# Patient Record
Sex: Female | Born: 1970 | Race: White | Hispanic: No | Marital: Single | Smoking: Current every day smoker
Health system: Southern US, Community
[De-identification: ages and names within clinical notes are randomized; demographics above are authoritative.]

## PROBLEM LIST (undated history)

## (undated) DIAGNOSIS — E039 Hypothyroidism, unspecified: Secondary | ICD-10-CM

## (undated) DIAGNOSIS — M549 Dorsalgia, unspecified: Secondary | ICD-10-CM

## (undated) DIAGNOSIS — R519 Headache, unspecified: Secondary | ICD-10-CM

## (undated) DIAGNOSIS — S99919A Unspecified injury of unspecified ankle, initial encounter: Secondary | ICD-10-CM

## (undated) DIAGNOSIS — F419 Anxiety disorder, unspecified: Secondary | ICD-10-CM

## (undated) DIAGNOSIS — J45909 Unspecified asthma, uncomplicated: Secondary | ICD-10-CM

## (undated) DIAGNOSIS — M1990 Unspecified osteoarthritis, unspecified site: Secondary | ICD-10-CM

## (undated) DIAGNOSIS — R51 Headache: Secondary | ICD-10-CM

## (undated) DIAGNOSIS — E785 Hyperlipidemia, unspecified: Secondary | ICD-10-CM

## (undated) HISTORY — PX: TUBAL LIGATION: SHX77

## (undated) HISTORY — PX: CHOLECYSTECTOMY: SHX55

## (undated) HISTORY — DX: Hypothyroidism, unspecified: E03.9

## (undated) HISTORY — DX: Dorsalgia, unspecified: M54.9

## (undated) HISTORY — DX: Anxiety disorder, unspecified: F41.9

## (undated) HISTORY — DX: Unspecified asthma, uncomplicated: J45.909

## (undated) HISTORY — PX: SHOULDER SURGERY: SHX246

## (undated) HISTORY — DX: Hyperlipidemia, unspecified: E78.5

## (undated) HISTORY — PX: ABDOMINAL HYSTERECTOMY: SHX81

## (undated) HISTORY — PX: HEMORROIDECTOMY: SUR656

## (undated) HISTORY — PX: TONSILLECTOMY AND ADENOIDECTOMY: SHX28

---
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
## 2002-08-25 ENCOUNTER — Emergency Department (HOSPITAL_COMMUNITY): Admission: EM | Admit: 2002-08-25 | Discharge: 2002-08-25 | Payer: Self-pay | Admitting: *Deleted

## 2002-08-25 ENCOUNTER — Encounter: Payer: Self-pay | Admitting: *Deleted

## 2005-12-07 ENCOUNTER — Ambulatory Visit (HOSPITAL_COMMUNITY): Admission: RE | Admit: 2005-12-07 | Discharge: 2005-12-07 | Payer: Self-pay | Admitting: General Surgery

## 2006-06-04 ENCOUNTER — Encounter: Payer: Self-pay | Admitting: Orthopedic Surgery

## 2006-06-21 ENCOUNTER — Encounter: Payer: Self-pay | Admitting: Orthopedic Surgery

## 2007-11-10 ENCOUNTER — Emergency Department (HOSPITAL_COMMUNITY): Admission: EM | Admit: 2007-11-10 | Discharge: 2007-11-10 | Payer: Self-pay | Admitting: Emergency Medicine

## 2009-02-21 ENCOUNTER — Emergency Department (HOSPITAL_COMMUNITY): Admission: EM | Admit: 2009-02-21 | Discharge: 2009-02-21 | Payer: Self-pay | Admitting: Emergency Medicine

## 2009-07-27 ENCOUNTER — Encounter (HOSPITAL_COMMUNITY): Admission: RE | Admit: 2009-07-27 | Discharge: 2009-08-17 | Payer: Self-pay | Admitting: Orthopedic Surgery

## 2010-04-05 ENCOUNTER — Other Ambulatory Visit: Admission: RE | Admit: 2010-04-05 | Discharge: 2010-04-05 | Payer: Self-pay | Admitting: Obstetrics & Gynecology

## 2010-09-11 ENCOUNTER — Encounter: Payer: Self-pay | Admitting: Family Medicine

## 2010-11-02 ENCOUNTER — Other Ambulatory Visit (HOSPITAL_COMMUNITY): Payer: Self-pay | Admitting: Family Medicine

## 2010-11-02 DIAGNOSIS — G8929 Other chronic pain: Secondary | ICD-10-CM

## 2010-11-04 ENCOUNTER — Other Ambulatory Visit (HOSPITAL_COMMUNITY): Payer: Self-pay

## 2010-11-04 ENCOUNTER — Ambulatory Visit (HOSPITAL_COMMUNITY): Payer: Medicaid Other

## 2010-11-11 ENCOUNTER — Ambulatory Visit (HOSPITAL_COMMUNITY)
Admission: RE | Admit: 2010-11-11 | Discharge: 2010-11-11 | Disposition: A | Discharge status: Home / Self Care | Payer: Medicaid Other | Source: Ambulatory Visit | Attending: Family Medicine | Admitting: Family Medicine

## 2010-11-11 DIAGNOSIS — M545 Low back pain, unspecified: Secondary | ICD-10-CM | POA: Insufficient documentation

## 2010-11-11 DIAGNOSIS — M51379 Other intervertebral disc degeneration, lumbosacral region without mention of lumbar back pain or lower extremity pain: Secondary | ICD-10-CM | POA: Insufficient documentation

## 2010-11-11 DIAGNOSIS — M549 Dorsalgia, unspecified: Secondary | ICD-10-CM

## 2010-11-11 DIAGNOSIS — G8929 Other chronic pain: Secondary | ICD-10-CM

## 2010-11-11 DIAGNOSIS — M5137 Other intervertebral disc degeneration, lumbosacral region: Secondary | ICD-10-CM | POA: Insufficient documentation

## 2010-11-11 DIAGNOSIS — M25559 Pain in unspecified hip: Secondary | ICD-10-CM | POA: Insufficient documentation

## 2010-11-11 DIAGNOSIS — R209 Unspecified disturbances of skin sensation: Secondary | ICD-10-CM | POA: Insufficient documentation

## 2011-01-06 NOTE — H&P (Signed)
Chloe Joyce, Chloe Joyce               ACCOUNT NO.:  [id number]   MEDICAL RECORD NO.:  [id number]          PATIENT TYPE:  AMB   LOCATION:  DAY                           FACILITY:  [facility]   PHYSICIAN:  Jerolyn Shin C. Katrinka Blazing, M.D.   DATE OF BIRTH:  Aug 25, 1970   DATE OF ADMISSION:  DATE OF DISCHARGE:  LH                                HISTORY & PHYSICAL   A 40 year old female with six-month history of recurrent rectal bleeding.  She denies constipation.  She denies abdominal pain.  She has failed to  mention her bleeding on her last two evaluations in the office.  She has  grown increasingly concerned because of the increase in frequency of  bleeding and she is scheduled for colonoscopy.   PAST MEDICAL HISTORY:  1.  History of asthma.  2.  Depression.  3.  Gastroesophageal reflux disease.  4.  Osteoarthritis.  5.  Chronic low back pain.   MEDICATIONS:  1.  Zantac 150 twice a day.  2.  Lortab 5/50 four times daily.  3.  Flonase nasal spray twice daily.  4.  Claritin 10 mg daily.  5.  Xanax 0.5 mg three times daily.  6.  Theo-Dur 300 mg twice daily.  7.  Albuterol meter dose inhaler 1 puff four times daily.   SURGERY:  C-section and right shoulder reconstruction.   SOCIAL HISTORY:  She is a single mother of three children.  Unemployed.  She  smokes one pack of cigarettes per day.  She drinks occasional alcoholic  beverage.   PHYSICAL EXAMINATION:  VITAL SIGNS:  Blood pressure 118/78, pulse 78,  respirations 20.  Weight 167 pounds.  HEENT:  Unremarkable.  NECK:  Supple.  No JVD, bruit, adenopathy or thyromegaly.  CHEST:  Clear to auscultation.  HEART:  Regular rate and rhythm without murmur, rub, or gallop.  ABDOMEN:  Soft and nontender.  No masses.  RECTAL:  Normal.  No masses.  Stool guaiac negative.  EXTREMITIES:  No clubbing, cyanosis, or edema.  NEUROLOGICAL:  No focal motor, sensory or cerebellar deficits.   IMPRESSION:  1.  Recurrent rectal bleeding.  2.  Gastroesophageal  reflux disease.  3.  Low back pain.  4.  Bronchial asthma.  5.  Depression.  6.  Osteoarthritis.   PLAN:  Total colonoscopy.      Dirk Dress. Katrinka Blazing, M.D.  Electronically Signed     LCS/MEDQ  D:  12/06/2005  T:  12/06/2005  Job:  [id number]   cc:   Short Stay [facility]

## 2011-01-07 ENCOUNTER — Emergency Department (HOSPITAL_COMMUNITY): Payer: Medicaid Other

## 2011-01-07 ENCOUNTER — Emergency Department (HOSPITAL_COMMUNITY)
Admission: EM | Admit: 2011-01-07 | Discharge: 2011-01-07 | Disposition: A | Discharge status: Home / Self Care | Payer: Medicaid Other | Attending: Emergency Medicine | Admitting: Emergency Medicine

## 2011-01-07 DIAGNOSIS — S0990XA Unspecified injury of head, initial encounter: Secondary | ICD-10-CM | POA: Insufficient documentation

## 2011-01-07 DIAGNOSIS — S0003XA Contusion of scalp, initial encounter: Secondary | ICD-10-CM | POA: Insufficient documentation

## 2011-01-07 DIAGNOSIS — S025XXA Fracture of tooth (traumatic), initial encounter for closed fracture: Secondary | ICD-10-CM | POA: Insufficient documentation

## 2011-01-07 DIAGNOSIS — Y92009 Unspecified place in unspecified non-institutional (private) residence as the place of occurrence of the external cause: Secondary | ICD-10-CM | POA: Insufficient documentation

## 2011-01-07 DIAGNOSIS — S1093XA Contusion of unspecified part of neck, initial encounter: Secondary | ICD-10-CM | POA: Insufficient documentation

## 2012-10-23 ENCOUNTER — Other Ambulatory Visit (HOSPITAL_COMMUNITY): Payer: Self-pay | Admitting: Neurosurgery

## 2012-10-23 DIAGNOSIS — M549 Dorsalgia, unspecified: Secondary | ICD-10-CM

## 2012-10-23 DIAGNOSIS — M5410 Radiculopathy, site unspecified: Secondary | ICD-10-CM

## 2012-10-24 ENCOUNTER — Ambulatory Visit (HOSPITAL_COMMUNITY): Payer: Medicaid Other

## 2013-07-09 ENCOUNTER — Ambulatory Visit: Payer: Self-pay | Admitting: Obstetrics and Gynecology

## 2013-08-06 ENCOUNTER — Ambulatory Visit: Payer: Self-pay | Admitting: Obstetrics and Gynecology

## 2013-08-27 ENCOUNTER — Ambulatory Visit (INDEPENDENT_AMBULATORY_CARE_PROVIDER_SITE_OTHER): Payer: Medicaid Other | Admitting: Obstetrics and Gynecology

## 2013-08-27 ENCOUNTER — Encounter: Payer: Self-pay | Admitting: Obstetrics and Gynecology

## 2013-08-27 VITALS — BP 120/76 | Ht 66.0 in | Wt 166.6 lb

## 2013-08-27 DIAGNOSIS — R102 Pelvic and perineal pain: Secondary | ICD-10-CM

## 2013-08-27 DIAGNOSIS — N949 Unspecified condition associated with female genital organs and menstrual cycle: Secondary | ICD-10-CM

## 2013-08-27 MED ORDER — IBUPROFEN 800 MG PO TABS: 800.0000 mg | ORAL_TABLET | Freq: Three times a day (TID) | ORAL | Status: DC | PRN

## 2013-08-27 NOTE — Progress Notes (Signed)
Patient ID: YANAI HOBSON, female   DOB: Feb 22, 1971, 43 y.o.   MRN: [id number] Pt here today for pain in lower abdominal area/ pelvic area. Right side hurts worse then left. Ovarian cyst. Pt states that she is having heavy periods.   LAP r>L, menses now regular, last pap yrs ago. Last gyn ck 2012    [facility] Visit  Patient name: VASTIE DOUTY MRN [id number]  Date of birth: November 20, 1970  CC & HPI:  CHERYLENE FERRUFINO is a 43 y.o. female presenting today for pelvic ache R>L. Now midcycle  ROS:  Large babies x 3 by svd, cesarean x1 for 6lb 7 oz,(transverse Lie)  Pertinent History Reviewed:  Medical & Surgical Hx:  Reviewed: Significant for asthma, cig use,anxiety Medications: Reviewed & Updated - see associated section Social History: Reviewed -  reports that she has been smoking Cigarettes.  She has a 20 pack-year smoking history. She has never used smokeless tobacco.  Objective Findings:  Vitals: BP 120/76  Ht 5\' 6"  (1.676 m)  Wt 166 lb 9.6 oz (75.569 kg)  BMI 26.90 kg/m2  LMP 08/09/2013  Physical Examination: General appearance - alert, well appearing, and in no distress and oriented to person, place, and time Abdomen - soft, nontender, nondistended, no masses or organomegaly Pelvic - normal external genitalia, vulva, vagina, cervix, uterus and adnexa, examination not indicated Efg : laxity withrectocele Vag lax walls, 1 descensus,   Assessment & Plan:   Heavy menses,  Rectocele 1 degree ut descensus  Plan: return for pap, and discuss posterior repair.

## 2013-09-15 ENCOUNTER — Telehealth: Payer: Self-pay | Admitting: *Deleted

## 2013-09-15 NOTE — Telephone Encounter (Signed)
Spoke with pt. Pt states she is having abd/ovary pain. Getting worse. Has appt here tomorrow. I advised pt could go to ER tonight if pain was severe. Advised to keep scheduled appt here tomorrow. Pt voiced understanding. Rosine

## 2013-09-16 ENCOUNTER — Ambulatory Visit (INDEPENDENT_AMBULATORY_CARE_PROVIDER_SITE_OTHER): Payer: Medicaid Other | Admitting: Obstetrics and Gynecology

## 2013-09-16 ENCOUNTER — Other Ambulatory Visit (HOSPITAL_COMMUNITY)
Admission: RE | Admit: 2013-09-16 | Discharge: 2013-09-16 | Disposition: A | Discharge status: Home / Self Care | Payer: Medicaid Other | Source: Ambulatory Visit | Attending: Obstetrics and Gynecology | Admitting: Obstetrics and Gynecology

## 2013-09-16 ENCOUNTER — Encounter: Payer: Self-pay | Admitting: Obstetrics and Gynecology

## 2013-09-16 VITALS — BP 110/60 | Ht 66.0 in | Wt 167.0 lb

## 2013-09-16 DIAGNOSIS — Z113 Encounter for screening for infections with a predominantly sexual mode of transmission: Secondary | ICD-10-CM | POA: Insufficient documentation

## 2013-09-16 DIAGNOSIS — Z1151 Encounter for screening for human papillomavirus (HPV): Secondary | ICD-10-CM | POA: Insufficient documentation

## 2013-09-16 DIAGNOSIS — G8929 Other chronic pain: Secondary | ICD-10-CM

## 2013-09-16 DIAGNOSIS — R102 Pelvic and perineal pain: Secondary | ICD-10-CM

## 2013-09-16 DIAGNOSIS — N949 Unspecified condition associated with female genital organs and menstrual cycle: Secondary | ICD-10-CM

## 2013-09-16 DIAGNOSIS — Z124 Encounter for screening for malignant neoplasm of cervix: Secondary | ICD-10-CM | POA: Insufficient documentation

## 2013-09-16 DIAGNOSIS — Z01419 Encounter for gynecological examination (general) (routine) without abnormal findings: Secondary | ICD-10-CM
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
## 2013-09-16 NOTE — Patient Instructions (Signed)
Please bring your u/s record with you to an appt in 2 wks. If you want blood work at that appt, we will be able to do that at your followup ,and can go an u/s.

## 2013-09-24 ENCOUNTER — Ambulatory Visit: Payer: Medicaid Other | Admitting: Obstetrics and Gynecology

## 2013-09-25 ENCOUNTER — Other Ambulatory Visit: Payer: Self-pay | Admitting: Obstetrics and Gynecology

## 2013-09-25 DIAGNOSIS — N949 Unspecified condition associated with female genital organs and menstrual cycle: Secondary | ICD-10-CM

## 2013-09-30 ENCOUNTER — Ambulatory Visit (INDEPENDENT_AMBULATORY_CARE_PROVIDER_SITE_OTHER): Payer: Medicaid Other | Admitting: Obstetrics and Gynecology

## 2013-09-30 ENCOUNTER — Ambulatory Visit (INDEPENDENT_AMBULATORY_CARE_PROVIDER_SITE_OTHER): Payer: Medicaid Other

## 2013-09-30 ENCOUNTER — Encounter: Payer: Self-pay | Admitting: Obstetrics and Gynecology

## 2013-09-30 VITALS — BP 112/70 | Ht 66.0 in | Wt 168.2 lb

## 2013-09-30 DIAGNOSIS — N949 Unspecified condition associated with female genital organs and menstrual cycle: Secondary | ICD-10-CM

## 2013-09-30 DIAGNOSIS — R109 Unspecified abdominal pain: Secondary | ICD-10-CM

## 2013-09-30 DIAGNOSIS — Z113 Encounter for screening for infections with a predominantly sexual mode of transmission: Secondary | ICD-10-CM

## 2013-09-30 MED ORDER — TRAMADOL HCL 50 MG PO TABS: 50.0000 mg | ORAL_TABLET | Freq: Four times a day (QID) | ORAL | Status: DC | PRN

## 2013-09-30 NOTE — Patient Instructions (Signed)
Please keep a Pain calendar over the next month. Use ibuprofen and if necessary Tramadol.  I will consider a nerve block to the abdominal wall if the pain persists, and seems musculoskeltal

## 2013-10-01 LAB — RPR

## 2013-10-01 LAB — HSV 2 ANTIBODY, IGG: HSV 2 Glycoprotein G Ab, IgG: <0.1 IV

## 2013-10-01 LAB — HIV ANTIBODY (ROUTINE TESTING W REFLEX): HIV: NONREACTIVE

## 2013-10-01 LAB — HEPATITIS B SURFACE ANTIGEN: Hepatitis B Surface Ag: NEGATIVE

## 2013-10-01 LAB — HEPATITIS C ANTIBODY: HCV Ab: NEGATIVE

## 2013-10-02 NOTE — Progress Notes (Signed)
   [facility] Visit  Patient name: Chloe Joyce MRN [id number]  Date of birth: March 23, 1971  CC & HPI:  Chloe Joyce is a 43 y.o. female presenting today for followup pelvic pain, pt doing better.  ROS:    Pertinent History Reviewed:  Medical & Surgical Hx:  Reviewed: Significant for no changes Medications: Reviewed & Updated - see associated section Social History: Reviewed -  reports that she has been smoking Cigarettes.  She has a 20 pack-year smoking history. She has never used smokeless tobacco.  Objective Findings:  Vitals: BP 112/70  Ht 5\' 6"  (1.676 m)  Wt 168 lb 3.2 oz (76.295 kg)  BMI 27.16 kg/m2  LMP 09/09/2013  Physical Examination: General appearance - alert, well appearing, and in no distress, oriented to person, place, and time and remains distractable, difficult historian,  Mental status - alert, oriented to person, place, and time, confused Abdomen - soft, nontender, nondistended, no masses or organomegaly Pelvic - normal external genitalia, vulva, vagina, cervix, uterus and adnexa   Assessment & Plan:   Pt will not be given further analgesics, as she seems fine.  Pt obtained STD tests at end of visit, when she finally brought up that she wanted testing.

## 2013-10-03 ENCOUNTER — Telehealth: Payer: Self-pay | Admitting: *Deleted

## 2013-10-03 NOTE — Telephone Encounter (Signed)
Pt informed STD screening negative from 09/30/2013.

## 2013-10-03 NOTE — Telephone Encounter (Signed)
Message copied by Doyne Keel on Fri Oct 03, 2013  8:27 AM ------      Message from: Jonnie Kind      Created: Thu Oct 02, 2013  6:39 PM       All tests for STD are negative. Please inform pt ------

## 2013-10-29 ENCOUNTER — Encounter: Payer: Self-pay | Admitting: Obstetrics and Gynecology

## 2013-10-29 ENCOUNTER — Ambulatory Visit (INDEPENDENT_AMBULATORY_CARE_PROVIDER_SITE_OTHER): Payer: Medicaid Other | Admitting: Obstetrics and Gynecology

## 2013-10-29 VITALS — BP 112/68 | Ht 62.0 in | Wt 170.0 lb

## 2013-10-29 DIAGNOSIS — R1031 Right lower quadrant pain: Secondary | ICD-10-CM

## 2013-10-29 DIAGNOSIS — G8929 Other chronic pain: Secondary | ICD-10-CM | POA: Insufficient documentation

## 2013-10-29 LAB — CBC
HCT: 37.2 % (ref 36.0–46.0)
Hemoglobin: 12.2 g/dL (ref 12.0–15.0)
MCH: 31.8 pg (ref 26.0–34.0)
MCHC: 32.8 g/dL (ref 30.0–36.0)
MCV: 96.9 fL (ref 78.0–100.0)
Platelets: 333 10*3/uL (ref 150–400)
RBC: 3.84 MIL/uL — ABNORMAL LOW (ref 3.87–5.11)
RDW: 13 % (ref 11.5–15.5)
WBC: 6.2 10*3/uL (ref 4.0–10.5)

## 2013-10-29 NOTE — Patient Instructions (Signed)
Follow up with your primary care or a primary care of your choice for your chronic pain. Your pain does not have any GYN identifiable cause. Could be related to back.

## 2013-10-29 NOTE — Progress Notes (Signed)
This chart was scribed by Jenne Campus, Medical Scribe, for Dr. Mallory Shirk on 10/29/13 at 2:41 PM. This chart was reviewed by Dr. Mallory Shirk and is accurate.    [facility] Visit  Patient name: Chloe Joyce MRN [id number]  Date of birth: Sep 22, 1970  CC & HPI:  Chloe Joyce is a 43 y.o. female presenting today for :"same thing" has pain she rates a "10" quite often, several times a month. 3weeks / month. LMP was 8 March. Pt feels "used to it." Localizes pain to RLQ, will radiate down the right leg occasionally. Worse if sitting up.  Negative STD testing on 09/30/13. US showed a simple cyst disproportionate to pain. Last BM was ?this morning. S/p BTL and c-section.   PCP is [facility]. One c-section.  ROS:  +chronic pelvic pain Denies any other symptoms  Pertinent History Reviewed:  Medical & Surgical Hx:  Reviewed: Significant for chronic pelvic pain Medications: Reviewed & Updated - see associated section Social History: Reviewed -  reports that she has been smoking Cigarettes.  She has a 20 pack-year smoking history. She has never used smokeless tobacco.  Objective Findings:  Vitals: BP 112/68  Ht 5\' 2"  (1.575 m)  Wt 170 lb (77.111 kg)  BMI 31.09 kg/m2 Chaperone present for exam which was performed with pt's permission. Physical Examination: General appearance - alert, well appearing, and in no distress and oriented to person, place, and time Mental status - alert, oriented to person, place, and time, normal mood, behavior, speech, dress, motor activity, and thought processes Abdomen - soft, nontender, nondistended, no masses or organomegaly bowel sounds normal Pelvic - normal external genitalia, vulva, vagina, cervix, and adnexa, non-tender and totally mobile uterus, minimal discomfort with palpation of uterus "I can feel you touching me"   Assessment & Plan:  A: 1. No identifiable GYN ideology to chronic RLQ pain  P: 1. Check CBC sed  rate  2. Recommend referral back to PCP of pt's choice or [facility]

## 2013-10-30 LAB — SEDIMENTATION RATE: Sed Rate: 1 mm/hr (ref 0–22)

## 2014-02-16 ENCOUNTER — Emergency Department (HOSPITAL_COMMUNITY)
Admission: EM | Admit: 2014-02-16 | Discharge: 2014-02-16 | Disposition: A | Discharge status: Home / Self Care | Payer: Medicaid Other | Attending: Emergency Medicine | Admitting: Emergency Medicine

## 2014-02-16 ENCOUNTER — Emergency Department (HOSPITAL_COMMUNITY): Payer: Medicaid Other

## 2014-02-16 ENCOUNTER — Encounter (HOSPITAL_COMMUNITY): Payer: Self-pay | Admitting: Emergency Medicine

## 2014-02-16 DIAGNOSIS — Y92410 Unspecified street and highway as the place of occurrence of the external cause: Secondary | ICD-10-CM | POA: Insufficient documentation

## 2014-02-16 DIAGNOSIS — F411 Generalized anxiety disorder: Secondary | ICD-10-CM | POA: Insufficient documentation

## 2014-02-16 DIAGNOSIS — E785 Hyperlipidemia, unspecified: Secondary | ICD-10-CM | POA: Insufficient documentation

## 2014-02-16 DIAGNOSIS — S139XXA Sprain of joints and ligaments of unspecified parts of neck, initial encounter: Secondary | ICD-10-CM | POA: Insufficient documentation

## 2014-02-16 DIAGNOSIS — F17200 Nicotine dependence, unspecified, uncomplicated: Secondary | ICD-10-CM | POA: Insufficient documentation

## 2014-02-16 DIAGNOSIS — S161XXA Strain of muscle, fascia and tendon at neck level, initial encounter: Secondary | ICD-10-CM

## 2014-02-16 DIAGNOSIS — Y9389 Activity, other specified: Secondary | ICD-10-CM | POA: Insufficient documentation

## 2014-02-16 DIAGNOSIS — J45909 Unspecified asthma, uncomplicated: Secondary | ICD-10-CM | POA: Insufficient documentation

## 2014-02-16 DIAGNOSIS — Z79899 Other long term (current) drug therapy: Secondary | ICD-10-CM | POA: Insufficient documentation

## 2014-02-16 MED ORDER — CYCLOBENZAPRINE HCL 10 MG PO TABS: 10.0000 mg | ORAL_TABLET | Freq: Two times a day (BID) | ORAL | Status: DC | PRN
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
## 2014-02-16 NOTE — ED Notes (Signed)
Pt continues to c/o pain in neck and back.  Pt expressing anger with physician at not being prescribed narcotics. Reinforced with pt various methods for pain control.  Pt continues to be angry.
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
## 2014-06-30 ENCOUNTER — Encounter (HOSPITAL_COMMUNITY): Payer: Self-pay

## 2014-06-30 ENCOUNTER — Emergency Department (HOSPITAL_COMMUNITY)
Admission: EM | Admit: 2014-06-30 | Discharge: 2014-07-01 | Disposition: A | Discharge status: Home / Self Care | Payer: Medicaid Other | Attending: Emergency Medicine | Admitting: Emergency Medicine

## 2014-06-30 DIAGNOSIS — Z791 Long term (current) use of non-steroidal anti-inflammatories (NSAID): Secondary | ICD-10-CM | POA: Insufficient documentation

## 2014-06-30 DIAGNOSIS — E785 Hyperlipidemia, unspecified: Secondary | ICD-10-CM | POA: Diagnosis not present

## 2014-06-30 DIAGNOSIS — J441 Chronic obstructive pulmonary disease with (acute) exacerbation: Secondary | ICD-10-CM

## 2014-06-30 DIAGNOSIS — Z7952 Long term (current) use of systemic steroids: Secondary | ICD-10-CM | POA: Diagnosis not present

## 2014-06-30 DIAGNOSIS — F419 Anxiety disorder, unspecified: Secondary | ICD-10-CM | POA: Diagnosis not present

## 2014-06-30 DIAGNOSIS — Z720 Tobacco use: Secondary | ICD-10-CM | POA: Insufficient documentation

## 2014-06-30 DIAGNOSIS — Z792 Long term (current) use of antibiotics: Secondary | ICD-10-CM | POA: Diagnosis not present

## 2014-06-30 DIAGNOSIS — R0602 Shortness of breath: Secondary | ICD-10-CM | POA: Diagnosis present

## 2014-06-30 DIAGNOSIS — R062 Wheezing: Secondary | ICD-10-CM

## 2014-06-30 DIAGNOSIS — Z79899 Other long term (current) drug therapy: Secondary | ICD-10-CM | POA: Insufficient documentation
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
## 2014-07-01 ENCOUNTER — Emergency Department (HOSPITAL_COMMUNITY): Payer: Medicaid Other

## 2014-07-01 MED ORDER — IPRATROPIUM-ALBUTEROL 0.5-2.5 (3) MG/3ML IN SOLN
3.0000 mL | Freq: Once | RESPIRATORY_TRACT | Status: AC
  Administered 2014-07-01: 3 mL via RESPIRATORY_TRACT
  Filled 2014-07-01: qty 3

## 2014-07-01 MED ORDER — ALBUTEROL SULFATE HFA 108 (90 BASE) MCG/ACT IN AERS: 1.0000 | INHALATION_SPRAY | Freq: Four times a day (QID) | RESPIRATORY_TRACT | Status: DC | PRN

## 2014-07-01 MED ORDER — ALBUTEROL SULFATE (2.5 MG/3ML) 0.083% IN NEBU
2.5000 mg | INHALATION_SOLUTION | Freq: Once | RESPIRATORY_TRACT | Status: AC
  Administered 2014-07-01: 2.5 mg via RESPIRATORY_TRACT
  Filled 2014-07-01: qty 3

## 2014-07-01 MED ORDER — THEOPHYLLINE ER 300 MG PO CP24: 300.0000 mg | ORAL_CAPSULE | Freq: Three times a day (TID) | ORAL | Status: DC

## 2014-07-01 MED ORDER — AZITHROMYCIN 250 MG PO TABS: ORAL_TABLET | ORAL | Status: DC

## 2014-07-01 MED ORDER — PREDNISONE 50 MG PO TABS
60.0000 mg | ORAL_TABLET | Freq: Once | ORAL | Status: AC
  Administered 2014-07-01: 60 mg via ORAL
  Filled 2014-07-01 (×2): qty 1

## 2014-07-01 MED ORDER — PREDNISONE 50 MG PO TABS: ORAL_TABLET | ORAL | Status: DC

## 2014-07-01 NOTE — ED Notes (Signed)
Patient ambulatory to restroom. Family remain at bedside.

## 2014-07-01 NOTE — ED Provider Notes (Signed)
CSN: [id number]     Arrival date & time 06/30/14  2348 History   First MD Initiated Contact with Patient 06/30/14 2354     Chief Complaint  Patient presents with  . Shortness of Breath     (Consider location/radiation/quality/duration/timing/severity/associated sxs/prior Treatment) HPI.....coughing and wheezing for 2 weeks with associated shortness of breath. Patient is a long-term smoker. No fever, chills, rusty sputum. She gets short of breath with exertion. Severity is mild to moderate. She was unable to see her primary care doctor  Past Medical History  Diagnosis Date  . Anxiety   . Asthma   . Hyperlipidemia    Past Surgical History  Procedure Laterality Date  . Tubal ligation    . Tonsillectomy and adenoidectomy  1988  . Shoulder surgery Right   . Cesarean section    . Hemorroidectomy    . Cholecystectomy     Family History  Problem Relation Age of Onset  . Hypertension Mother   . Cancer Mother     breast  . Stroke Mother   . Hyperlipidemia Mother   . Varicose Veins Mother   . Miscarriages / [place] Mother   . Asthma Brother   . Diabetes Maternal Grandmother    History  Substance Use Topics  . Smoking status: Current Every Day Smoker -- 1.00 packs/day for 20 years    Types: Cigarettes  . Smokeless tobacco: Never Used  . Alcohol Use: Yes     Comment: occ.   OB History    No data available     Review of Systems  All other systems reviewed and are negative.     Allergies  Review of patient's allergies indicates no known allergies.  Home Medications   Prior to Admission medications   Medication Sig Start Date End Date Taking? Authorizing Provider  loratadine (CLARITIN) 10 MG tablet Take 10 mg by mouth daily.   Yes Historical Provider, MD  simvastatin (ZOCOR) 40 MG tablet Take 20 mg by mouth every evening.   Yes Historical Provider, MD  albuterol (PROVENTIL HFA;VENTOLIN HFA) 108 (90 BASE) MCG/ACT inhaler Inhale 1-2 puffs into the lungs every 6  (six) hours as needed for wheezing or shortness of breath. 07/01/14   Nat Christen, MD  azithromycin (ZITHROMAX Z-PAK) 250 MG tablet 2 po day one, then 1 daily x 4 days 07/01/14   Nat Christen, MD  cyclobenzaprine (FLEXERIL) 10 MG tablet Take 1 tablet (10 mg total) by mouth 2 (two) times daily as needed for muscle spasms. 02/16/14   Shaune Pollack, MD  furosemide (LASIX) 20 MG tablet Take 20 mg by mouth daily as needed for fluid.    Historical Provider, MD  ibuprofen (ADVIL,MOTRIN) 800 MG tablet Take 1 tablet (800 mg total) by mouth every 8 (eight) hours as needed. 08/27/13   Jonnie Kind, MD  predniSONE (DELTASONE) 50 MG tablet 1 daily for 6 days, one half daily for 6 days 07/01/14   Nat Christen, MD  traMADol (ULTRAM) 50 MG tablet Take 50 mg by mouth 3 (three) times daily as needed for moderate pain or severe pain.    Historical Provider, MD   BP 117/79 mmHg  Pulse 120  Temp(Src) 98 F (36.7 C) (Oral)  Resp 23  Ht 5\' 6"  (1.676 m)  Wt 150 lb (68.04 kg)  BMI 24.22 kg/m2  SpO2 98%  LMP 06/16/2014 Physical Exam  Constitutional: She is oriented to person, place, and time. She appears well-developed and well-nourished.  HENT:  Head: Normocephalic  and atraumatic.  Eyes: Conjunctivae and EOM are normal. Pupils are equal, round, and reactive to light.  Neck: Normal range of motion. Neck supple.  Cardiovascular: Normal rate, regular rhythm and normal heart sounds.   Pulmonary/Chest: Effort normal.  Bilateral expiratory wheeze  Abdominal: Soft. Bowel sounds are normal.  Musculoskeletal: Normal range of motion.  Neurological: She is alert and oriented to person, place, and time.  Skin: Skin is warm and dry.  Psychiatric: She has a normal mood and affect. Her behavior is normal.  Nursing note and vitals reviewed.   ED Course  Procedures (including critical care time) Labs Review Labs Reviewed - No data to display  Imaging Review Dg Chest 2 View  07/01/2014   CLINICAL DATA:  Shortness of  breath and wheezing for 2 weeks.  EXAM: CHEST  2 VIEW  COMPARISON:  None.  FINDINGS: Heart size and mediastinal contours are within normal limits. Both lungs are clear. Visualized skeletal structures are unremarkable.  IMPRESSION: Negative exam.   Electronically Signed   By: Inge Rise M.D.   On: 07/01/2014 00:46     EKG Interpretation None      MDM   Final diagnoses:  Wheezing  COPD exacerbation    Patient is ambulatory without obvious dyspnea. 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 She feels better after an albuterol/Atrovent nebulizer treatment. Chest x-ray negative for pneumonia.  Because of the longevity of her symptoms, I will start an antibiotic. Discharge medications Zithromax, prednisone, albuterol inhaler.    Nat Christen, MD 07/01/14 [phone]

## 2014-07-01 NOTE — Discharge Instructions (Signed)
Chest x-ray showed no pneumonia. Stop smoking. Prescriptions for prednisone, inhaler, antibiotic. Follow-up your primary care doctor

## 2014-07-01 NOTE — ED Notes (Signed)
Patient verbalizes understanding of discharge instructions, prescription medications, home care, and follow up care. Patient out of department at this time with family.

## 2014-08-28 ENCOUNTER — Encounter (HOSPITAL_COMMUNITY): Payer: Self-pay | Admitting: Emergency Medicine

## 2014-08-28 ENCOUNTER — Emergency Department (HOSPITAL_COMMUNITY)
Admission: EM | Admit: 2014-08-28 | Discharge: 2014-08-28 | Disposition: A | Discharge status: Home / Self Care | Payer: Medicaid Other | Attending: Emergency Medicine | Admitting: Emergency Medicine

## 2014-08-28 ENCOUNTER — Emergency Department (HOSPITAL_COMMUNITY): Payer: Medicaid Other

## 2014-08-28 DIAGNOSIS — E785 Hyperlipidemia, unspecified: Secondary | ICD-10-CM | POA: Insufficient documentation

## 2014-08-28 DIAGNOSIS — Z7952 Long term (current) use of systemic steroids: Secondary | ICD-10-CM | POA: Diagnosis not present

## 2014-08-28 DIAGNOSIS — Z720 Tobacco use: Secondary | ICD-10-CM | POA: Diagnosis not present

## 2014-08-28 DIAGNOSIS — F419 Anxiety disorder, unspecified: Secondary | ICD-10-CM | POA: Insufficient documentation

## 2014-08-28 DIAGNOSIS — R05 Cough: Secondary | ICD-10-CM

## 2014-08-28 DIAGNOSIS — J45901 Unspecified asthma with (acute) exacerbation: Secondary | ICD-10-CM | POA: Diagnosis not present

## 2014-08-28 DIAGNOSIS — Z79899 Other long term (current) drug therapy: Secondary | ICD-10-CM | POA: Insufficient documentation

## 2014-08-28 DIAGNOSIS — R059 Cough, unspecified: Secondary | ICD-10-CM

## 2014-08-28 LAB — URINALYSIS, ROUTINE W REFLEX MICROSCOPIC
Bilirubin Urine: NEGATIVE
Glucose, UA: NEGATIVE mg/dL
Hgb urine dipstick: NEGATIVE
Ketones, ur: NEGATIVE mg/dL
Leukocytes, UA: NEGATIVE
Nitrite: NEGATIVE
Protein, ur: NEGATIVE mg/dL
Specific Gravity, Urine: <1.005 — ABNORMAL LOW (ref 1.005–1.030)
Urobilinogen, UA: 0.2 mg/dL (ref 0.0–1.0)
pH: 5.5 (ref 5.0–8.0)

## 2014-08-28 MED ORDER — ALBUTEROL SULFATE HFA 108 (90 BASE) MCG/ACT IN AERS
2.0000 | INHALATION_SPRAY | RESPIRATORY_TRACT | Status: DC | PRN
  Administered 2014-08-28: 2 via RESPIRATORY_TRACT
  Filled 2014-08-28: qty 6.7

## 2014-08-28 MED ORDER — PREDNISONE 50 MG PO TABS
60.0000 mg | ORAL_TABLET | Freq: Once | ORAL | Status: AC
  Administered 2014-08-28: 60 mg via ORAL
  Filled 2014-08-28 (×2): qty 1

## 2014-08-28 MED ORDER — IPRATROPIUM-ALBUTEROL 0.5-2.5 (3) MG/3ML IN SOLN
3.0000 mL | Freq: Once | RESPIRATORY_TRACT | Status: AC
  Administered 2014-08-28: 3 mL via RESPIRATORY_TRACT
  Filled 2014-08-28: qty 3

## 2014-08-28 MED ORDER — PREDNISONE 50 MG PO TABS: 50.0000 mg | ORAL_TABLET | Freq: Every day | ORAL | Status: DC
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
## 2014-08-28 NOTE — Discharge Instructions (Signed)
Asthma Asthma is a recurring condition in which the airways tighten and narrow. Asthma can make it difficult to breathe. It can cause coughing, wheezing, and shortness of breath. Asthma episodes, also called asthma attacks, range from minor to life-threatening. Asthma cannot be cured, but medicines and lifestyle changes can help control it. CAUSES Asthma is believed to be caused by inherited (genetic) and environmental factors, but its exact cause is unknown. Asthma may be triggered by allergens, lung infections, or irritants in the air. Asthma triggers are different for each person. Common triggers include:   Animal dander.  Dust mites.  Cockroaches.  Pollen from trees or grass.  Mold.  Smoke.  Air pollutants such as dust, household cleaners, hair sprays, aerosol sprays, paint fumes, strong chemicals, or strong odors.  Cold air, weather changes, and winds (which increase molds and pollens in the air).  Strong emotional expressions such as crying or laughing hard.  Stress.  Certain medicines (such as aspirin) or types of drugs (such as beta-blockers).  Sulfites in foods and drinks. Foods and drinks that may contain sulfites include dried fruit, potato chips, and sparkling grape juice.  Infections or inflammatory conditions such as the flu, a cold, or an inflammation of the nasal membranes (rhinitis).  Gastroesophageal reflux disease (GERD).  Exercise or strenuous activity. SYMPTOMS Symptoms may occur immediately after asthma is triggered or many hours later. Symptoms include:  Wheezing.  Excessive nighttime or early morning coughing.  Frequent or severe coughing with a common cold.  Chest tightness.  Shortness of breath. DIAGNOSIS  The diagnosis of asthma is made by a review of your medical history and a physical exam. Tests may also be performed. These may include:  Lung function studies. These tests show how much air you breathe in and out.  Allergy  tests.  Imaging tests such as X-rays. TREATMENT  Asthma cannot be cured, but it can usually be controlled. Treatment involves identifying and avoiding your asthma triggers. It also involves medicines. There are 2 classes of medicine used for asthma treatment:   Controller medicines. These prevent asthma symptoms from occurring. They are usually taken every day.  Reliever or rescue medicines. These quickly relieve asthma symptoms. They are used as needed and provide short-term relief. Your health care provider will help you create an asthma action plan. An asthma action plan is a written plan for managing and treating your asthma attacks. It includes a list of your asthma triggers and how they may be avoided. It also includes information on when medicines should be taken and when their dosage should be changed. An action plan may also involve the use of a device called a peak flow meter. A peak flow meter measures how well the lungs are working. It helps you monitor your condition. HOME CARE INSTRUCTIONS   Take medicines only as directed by your health care provider. Speak with your health care provider if you have questions about how or when to take the medicines.  Use a peak flow meter as directed by your health care provider. Record and keep track of readings.  Understand and use the action plan to help minimize or stop an asthma attack without needing to seek medical care.  Control your home environment in the following ways to help prevent asthma attacks:  Do not smoke. Avoid being exposed to secondhand smoke.  Change your heating and air conditioning filter regularly.  Limit your use of fireplaces and wood stoves.  Get rid of pests (such as roaches and  mice) and their droppings.  Throw away plants if you see mold on them.  Clean your floors and dust regularly. Use unscented cleaning products.  Try to have someone else vacuum for you regularly. Stay out of rooms while they are  being vacuumed and for a short while afterward. If you vacuum, use a dust mask from a hardware store, a double-layered or microfilter vacuum cleaner bag, or a vacuum cleaner with a HEPA filter.  Replace carpet with wood, tile, or vinyl flooring. Carpet can trap dander and dust.  Use allergy-proof pillows, mattress covers, and box spring covers.  Wash bed sheets and blankets every week in hot water and dry them in a dryer.  Use blankets that are made of polyester or cotton.  Clean bathrooms and kitchens with bleach. If possible, have someone repaint the walls in these rooms with mold-resistant paint. Keep out of the rooms that are being cleaned and painted.  Wash hands frequently. SEEK MEDICAL CARE IF:   You have wheezing, shortness of breath, or a cough even if taking medicine to prevent attacks.  The colored mucus you cough up (sputum) is thicker than usual.  Your sputum changes from clear or white to yellow, green, gray, or bloody.  You have any problems that may be related to the medicines you are taking (such as a rash, itching, swelling, or trouble breathing).  You are using a reliever medicine more than 2-3 times per week.  Your peak flow is still at 50-79% of your personal best after following your action plan for 1 hour.  You have a fever. SEEK IMMEDIATE MEDICAL CARE IF:   You seem to be getting worse and are unresponsive to treatment during an asthma attack.  You are short of breath even at rest.  You get short of breath when doing very little physical activity.  You have difficulty eating, drinking, or talking due to asthma symptoms.  You develop chest pain.  You develop a fast heartbeat.  You have a bluish color to your lips or fingernails.  You are light-headed, dizzy, or faint.  Your peak flow is less than 50% of your personal best. MAKE SURE YOU:   Understand these instructions.  Will watch your condition.  Will get help right away if you are not  doing well or get worse. Document Released: 08/07/2005 Document Revised: 12/22/2013 Document Reviewed: 03/06/2013 [facility] Patient Information 2015 [place], [place]. This information is not intended to replace advice given to you by your health care provider. Make sure you discuss any questions you have with your health care provider.  Smoking Hazards Smoking cigarettes is extremely bad for your health. Tobacco smoke has over 200 known poisons in it. It contains the poisonous gases nitrogen oxide and carbon monoxide. There are over 60 chemicals in tobacco smoke that cause cancer. Some of the chemicals found in cigarette smoke include:   Cyanide.   Benzene.   Formaldehyde.   Methanol (wood alcohol).   Acetylene (fuel used in welding torches).   Ammonia.  Even smoking lightly shortens your life expectancy by several years. You can greatly reduce the risk of medical problems for you and your family by stopping now. Smoking is the most preventable cause of death and disease in our society. Within days of quitting smoking, your circulation improves, you decrease the risk of having a heart attack, and your lung capacity improves. There may be some increased phlegm in the first few days after quitting, and it may take months for  your lungs to clear up completely. Quitting for 10 years reduces your risk of developing lung cancer to almost that of a nonsmoker.  WHAT ARE THE RISKS OF SMOKING? Cigarette smokers have an increased risk of many serious medical problems, including:  Lung cancer.   Lung disease (such as pneumonia, bronchitis, and emphysema).   Heart attack and chest pain due to the heart not getting enough oxygen (angina).   Heart disease and peripheral blood vessel disease.   Hypertension.   Stroke.   Oral cancer (cancer of the lip, mouth, or voice box).   Bladder cancer.   Pancreatic cancer.   Cervical cancer.   Pregnancy complications, including premature  birth.   Stillbirths and smaller newborn babies, birth defects, and genetic damage to sperm.   Early menopause.   Lower estrogen level for women.   Infertility.   Facial wrinkles.   Blindness.   Increased risk of broken bones (fractures).   Senile dementia.   Stomach ulcers and internal bleeding.   Delayed wound healing and increased risk of complications during surgery. Because of secondhand smoke exposure, children of smokers have an increased risk of the following:   Sudden infant death syndrome (SIDS).   Respiratory infections.   Lung cancer.   Heart disease.   Ear infections.  WHY IS SMOKING ADDICTIVE? Nicotine is the chemical agent in tobacco that is capable of causing addiction or dependence. When you smoke and inhale, nicotine is absorbed rapidly into the bloodstream through your lungs. Both inhaled and noninhaled nicotine may be addictive.  WHAT ARE THE BENEFITS OF QUITTING?  There are many health benefits to quitting smoking. Some are:   The likelihood of developing cancer and heart disease decreases. Health improvements are seen almost immediately.   Blood pressure, pulse rate, and breathing patterns start returning to normal soon after quitting.   People who quit may see an improvement in their overall quality of life.  HOW DO YOU QUIT SMOKING? Smoking is an addiction with both physical and psychological effects, and longtime habits can be hard to change. Your health care provider can recommend:  Programs and community resources, which may include group support, education, or therapy.  Replacement products, such as patches, gum, and nasal sprays. Use these products only as directed. Do not replace cigarette smoking with electronic cigarettes (commonly called e-cigarettes). The safety of e-cigarettes is unknown, and some may contain harmful chemicals. FOR MORE INFORMATION  American Lung Association: www.lung.org  American Cancer  Society: www.cancer.org Document Released: 09/14/2004 Document Revised: 05/28/2013 Document Reviewed: 01/27/2013 [facility] Patient Information 2015 [place], [place]. This information is not intended to replace advice given to you by your health care provider. Make sure you discuss any questions you have with your health care provider.  Albuterol inhalation aerosol What is this medicine? ALBUTEROL (al Norm[place]ean [place]) is a bronchodilator. It helps open up the airways in your lungs to make it easier to breathe. This medicine is used to treat and to prevent bronchospasm. This medicine may be used for other purposes; ask your health care provider or pharmacist if you have questions. COMMON BRAND NAME(S): Proair HFA, Proventil, Proventil HFA, Respirol, Ventolin, Ventolin HFA What should I tell my health care provider before I take this medicine? They need to know if you have any of the following conditions: -diabetes -heart disease or irregular heartbeat -high blood pressure -pheochromocytoma -seizures -thyroid disease -an unusual or allergic reaction to albuterol, levalbuterol, sulfites, other medicines, foods, dyes, or preservatives -pregnant or trying to  get pregnant -breast-feeding How should I use this medicine? This medicine is for inhalation through the mouth. Follow the directions on your prescription label. Take your medicine at regular intervals. Do not use more often than directed. Make sure that you are using your inhaler correctly. Ask you doctor or health care provider if you have any questions. Talk to your pediatrician regarding the use of this medicine in children. Special care may be needed. Overdosage: If you think you have taken too much of this medicine contact a poison control center or emergency room at once. NOTE: This medicine is only for you. Do not share this medicine with others. What if I miss a dose? If you miss a dose, use it as soon as you can. If it is almost time for  your next dose, use only that dose. Do not use double or extra doses. What may interact with this medicine? -anti-infectives like chloroquine and pentamidine -caffeine -cisapride -diuretics -medicines for colds -medicines for depression or for emotional or psychotic conditions -medicines for weight loss including some herbal products -methadone -some antibiotics like clarithromycin, erythromycin, levofloxacin, and linezolid -some heart medicines -steroid hormones like dexamethasone, cortisone, hydrocortisone -theophylline -thyroid hormones This list may not describe all possible interactions. Give your health care provider a list of all the medicines, herbs, non-prescription drugs, or dietary supplements you use. Also tell them if you smoke, drink alcohol, or use illegal drugs. Some items may interact with your medicine. What should I watch for while using this medicine? Tell your doctor or health care professional if your symptoms do not improve. Do not use extra albuterol. If your asthma or bronchitis gets worse while you are using this medicine, call your doctor right away. If your mouth gets dry try chewing sugarless gum or sucking hard candy. Drink water as directed. What side effects may I notice from receiving this medicine? Side effects that you should report to your doctor or health care professional as soon as possible: -allergic reactions like skin rash, itching or hives, swelling of the face, lips, or tongue -breathing problems -chest pain -feeling faint or lightheaded, falls -high blood pressure -irregular heartbeat -fever -muscle cramps or weakness -pain, tingling, numbness in the hands or feet -vomiting Side effects that usually do not require medical attention (report to your doctor or health care professional if they continue or are bothersome): -cough -difficulty sleeping -headache -nervousness or trembling -stomach upset -stuffy or runny nose -throat  irritation -unusual taste This list may not describe all possible side effects. Call your doctor for medical advice about side effects. You may report side effects to FDA at 1-800-FDA-1088. Where should I keep my medicine? Keep out of the reach of children. Store at room temperature between 15 and 30 degrees C (59 and 86 degrees F). The contents are under pressure and may burst when exposed to heat or flame. Do not freeze. This medicine does not work as well if it is too cold. Throw away any unused medicine after the expiration date. Inhalers need to be thrown away after the labeled number of puffs have been used or by the expiration date; whichever comes first. Ventolin HFA should be thrown away 12 months after removing from foil pouch. Check the instructions that come with your medicine. NOTE: This sheet is a summary. It may not cover all possible information. If you have questions about this medicine, talk to your doctor, pharmacist, or health care provider.  2015, [employer] Standard. (2013-01-23 10:57:17)  Prednisone tablets What  is this medicine? PREDNISONE (PRED ni sone) is a corticosteroid. It is commonly used to treat inflammation of the skin, joints, lungs, and other organs. Common conditions treated include asthma, allergies, and arthritis. It is also used for other conditions, such as blood disorders and diseases of the adrenal glands. This medicine may be used for other purposes; ask your health care provider or pharmacist if you have questions. COMMON BRAND NAME(S): Deltasone, Predone, Sterapred, Sterapred DS What should I tell my health care provider before I take this medicine? They need to know if you have any of these conditions: -Cushing's syndrome -diabetes -glaucoma -heart disease -high blood pressure -infection (especially a virus infection such as chickenpox, cold sores, or herpes) -kidney disease -liver disease -mental illness -myasthenia  gravis -osteoporosis -seizures -stomach or intestine problems -thyroid disease -an unusual or allergic reaction to lactose, prednisone, other medicines, foods, dyes, or preservatives -pregnant or trying to get pregnant -breast-feeding How should I use this medicine? Take this medicine by mouth with a glass of water. Follow the directions on the prescription label. Take this medicine with food. If you are taking this medicine once a day, take it in the morning. Do not take more medicine than you are told to take. Do not suddenly stop taking your medicine because you may develop a severe reaction. Your doctor will tell you how much medicine to take. If your doctor wants you to stop the medicine, the dose may be slowly lowered over time to avoid any side effects. Talk to your pediatrician regarding the use of this medicine in children. Special care may be needed. Overdosage: If you think you have taken too much of this medicine contact a poison control center or emergency room at once. NOTE: This medicine is only for you. Do not share this medicine with others. What if I miss a dose? If you miss a dose, take it as soon as you can. If it is almost time for your next dose, talk to your doctor or health care professional. You may need to miss a dose or take an extra dose. Do not take double or extra doses without advice. What may interact with this medicine? Do not take this medicine with any of the following medications: -metyrapone -mifepristone This medicine may also interact with the following medications: -aminoglutethimide -amphotericin B -aspirin and aspirin-like medicines -barbiturates -certain medicines for diabetes, like glipizide or glyburide -cholestyramine -cholinesterase inhibitors -cyclosporine -digoxin -diuretics -ephedrine -female hormones, like estrogens and birth control pills -isoniazid -ketoconazole -NSAIDS, medicines for pain and inflammation, like ibuprofen or  naproxen -phenytoin -rifampin -toxoids -vaccines -warfarin This list may not describe all possible interactions. Give your health care provider a list of all the medicines, herbs, non-prescription drugs, or dietary supplements you use. Also tell them if you smoke, drink alcohol, or use illegal drugs. Some items may interact with your medicine. What should I watch for while using this medicine? Visit your doctor or health care professional for regular checks on your progress. If you are taking this medicine over a prolonged period, carry an identification card with your name and address, the type and dose of your medicine, and your doctor's name and address. This medicine may increase your risk of getting an infection. Tell your doctor or health care professional if you are around anyone with measles or chickenpox, or if you develop sores or blisters that do not heal properly. If you are going to have surgery, tell your doctor or health care professional that you have  taken this medicine within the last twelve months. Ask your doctor or health care professional about your diet. You may need to lower the amount of salt you eat. This medicine may affect blood sugar levels. If you have diabetes, check with your doctor or health care professional before you change your diet or the dose of your diabetic medicine. What side effects may I notice from receiving this medicine? Side effects that you should report to your doctor or health care professional as soon as possible: -allergic reactions like skin rash, itching or hives, swelling of the face, lips, or tongue -changes in emotions or moods -changes in vision -depressed mood -eye pain -fever or chills, cough, sore throat, pain or difficulty passing urine -increased thirst -swelling of ankles, feet Side effects that usually do not require medical attention (report to your doctor or health care professional if they continue or are  bothersome): -confusion, excitement, restlessness -headache -nausea, vomiting -skin problems, acne, thin and shiny skin -trouble sleeping -weight gain This list may not describe all possible side effects. Call your doctor for medical advice about side effects. You may report side effects to FDA at 1-800-FDA-1088. Where should I keep my medicine? Keep out of the reach of children. Store at room temperature between 15 and 30 degrees C (59 and 86 degrees F). Protect from light. Keep container tightly closed. Throw away any unused medicine after the expiration date. NOTE: This sheet is a summary. It may not cover all possible information. If you have questions about this medicine, talk to your doctor, pharmacist, or health care provider.  2015, [employer] Standard. (2011-03-23 10:57:14)

## 2014-08-28 NOTE — ED Notes (Signed)
Pt in police custody on arrival to ED, [employer] remained at bedside with pt in handcuffs for duration of pt stay in ED.  Pt was given discharge prescriptions and nebulizer inhaler instructions from [facility] prior to discharge. Pt left in police custody.

## 2014-08-28 NOTE — ED Notes (Signed)
Discharge instructions given, pt demonstrated teach back and verbal understanding. Instructions also given to officers detaining patient.

## 2014-12-09 ENCOUNTER — Other Ambulatory Visit (HOSPITAL_COMMUNITY): Payer: Self-pay | Admitting: Nurse Practitioner

## 2014-12-09 ENCOUNTER — Ambulatory Visit (HOSPITAL_COMMUNITY)
Admission: RE | Admit: 2014-12-09 | Discharge: 2014-12-09 | Disposition: A | Discharge status: Home / Self Care | Payer: Medicaid Other | Source: Ambulatory Visit | Attending: Nurse Practitioner | Admitting: Nurse Practitioner

## 2014-12-09 DIAGNOSIS — M546 Pain in thoracic spine: Secondary | ICD-10-CM | POA: Diagnosis not present

## 2014-12-09 DIAGNOSIS — M4187 Other forms of scoliosis, lumbosacral region: Secondary | ICD-10-CM | POA: Diagnosis not present

## 2014-12-09 DIAGNOSIS — M79604 Pain in right leg: Secondary | ICD-10-CM | POA: Insufficient documentation

## 2014-12-09 DIAGNOSIS — M549 Dorsalgia, unspecified: Secondary | ICD-10-CM

## 2014-12-09 DIAGNOSIS — M4184 Other forms of scoliosis, thoracic region: Secondary | ICD-10-CM | POA: Diagnosis not present

## 2014-12-09 DIAGNOSIS — M545 Low back pain: Secondary | ICD-10-CM | POA: Diagnosis present

## 2015-01-14 ENCOUNTER — Other Ambulatory Visit (HOSPITAL_COMMUNITY): Payer: Self-pay | Admitting: Nurse Practitioner

## 2015-01-14 DIAGNOSIS — N946 Dysmenorrhea, unspecified: Secondary | ICD-10-CM

## 2015-01-19 ENCOUNTER — Ambulatory Visit (HOSPITAL_COMMUNITY): Admission: RE | Admit: 2015-01-19 | Payer: Medicaid Other | Source: Ambulatory Visit

## 2015-01-25 DIAGNOSIS — Z029 Encounter for administrative examinations, unspecified: Secondary | ICD-10-CM

## 2015-04-02 ENCOUNTER — Encounter: Payer: Self-pay | Admitting: *Deleted

## 2015-04-14 ENCOUNTER — Ambulatory Visit (INDEPENDENT_AMBULATORY_CARE_PROVIDER_SITE_OTHER): Payer: Medicaid Other | Admitting: Obstetrics and Gynecology

## 2015-04-14 ENCOUNTER — Encounter: Payer: Self-pay | Admitting: Obstetrics and Gynecology

## 2015-04-14 ENCOUNTER — Other Ambulatory Visit (HOSPITAL_COMMUNITY)
Admission: RE | Admit: 2015-04-14 | Discharge: 2015-04-14 | Disposition: A | Discharge status: Home / Self Care | Payer: Medicaid Other | Source: Ambulatory Visit | Attending: Obstetrics and Gynecology | Admitting: Obstetrics and Gynecology

## 2015-04-14 VITALS — BP 120/80 | Ht 66.0 in | Wt 151.0 lb

## 2015-04-14 DIAGNOSIS — Z01411 Encounter for gynecological examination (general) (routine) with abnormal findings: Secondary | ICD-10-CM | POA: Insufficient documentation

## 2015-04-14 DIAGNOSIS — Z1151 Encounter for screening for human papillomavirus (HPV): Secondary | ICD-10-CM | POA: Diagnosis present

## 2015-04-14 DIAGNOSIS — R87610 Atypical squamous cells of undetermined significance on cytologic smear of cervix (ASC-US): Secondary | ICD-10-CM

## 2015-04-14 NOTE — Progress Notes (Signed)
Patient ID: Chloe Joyce, female   DOB: May 23, 1971, 44 y.o.   MRN: [id number]  This chart was scribed for Jonnie Kind, MD by Erling Conte, ED Scribe. This patient was seen in room 1 and the patient's care was started at 2:51 PM.   [facility] Visit  Patient name: Chloe Joyce MRN [id number]  Date of birth: 1971/06/06  CC & HPI:  Chloe Joyce is a 44 y.o. female presenting today for constant, right pelvic onset 3 years. Pt reports associated dysmenorrhea. She states she has been taking antiinflammatories for the pain with no relief. She states she had an abnormal pap done in January 2015. Which showed atypical squamous cells of undetermined significance w/ negative HPV. She has not had a follow up pap since.   ROS:  + pelvic pain + dysmenorrhea No other complaints  Pertinent History Reviewed:   Reviewed: Significant for  Medical         Past Medical History  Diagnosis Date  . Anxiety   . Asthma   . Hyperlipidemia   . Back pain   . Hypothyroidism                               Surgical Hx:    Past Surgical History  Procedure Laterality Date  . Tubal ligation    . Tonsillectomy and adenoidectomy  1988  . Shoulder surgery Right   . Cesarean section    . Hemorroidectomy    . Cholecystectomy    . Tonsillectomy and adenoidectomy     Medications: Reviewed & Updated - see associated section                       Current outpatient prescriptions:  .  albuterol (PROVENTIL HFA;VENTOLIN HFA) 108 (90 BASE) MCG/ACT inhaler, Inhale 1-2 puffs into the lungs every 6 (six) hours as needed for wheezing or shortness of breath., Disp: 1 Inhaler, Rfl: 0 .  ALPRAZolam (XANAX) 1 MG tablet, Take 1 mg by mouth 3 (three) times daily., Disp: , Rfl:  .  cetirizine (ZYRTEC) 10 MG tablet, Take 10 mg by mouth as needed for allergies., Disp: , Rfl:  .  cyclobenzaprine (FLEXERIL) 10 MG tablet, Take 1 tablet (10 mg total) by mouth 2 (two) times daily as needed for muscle spasms. (Patient  taking differently: Take 10 mg by mouth 3 (three) times daily as needed for muscle spasms. ), Disp: 20 tablet, Rfl: 0 .  furosemide (LASIX) 20 MG tablet, Take 20 mg by mouth daily as needed for fluid., Disp: , Rfl:  .  ibuprofen (ADVIL,MOTRIN) 800 MG tablet, Take 1 tablet (800 mg total) by mouth every 8 (eight) hours as needed., Disp: 60 tablet, Rfl: 1 .  simvastatin (ZOCOR) 40 MG tablet, Take 20 mg by mouth every evening., Disp: , Rfl:  .  theophylline (THEO-24) 300 MG 24 hr capsule, Take 1 capsule (300 mg total) by mouth 3 (three) times daily., Disp: 60 capsule, Rfl: 1 .  traMADol (ULTRAM) 50 MG tablet, Take 50 mg by mouth every 6 (six) hours as needed for moderate pain or severe pain. , Disp: , Rfl:    Social History: Reviewed -  reports that she has been smoking Cigarettes.  She has a 20 pack-year smoking history. She has never used smokeless tobacco.  Objective Findings:  Vitals: Blood pressure 120/80, height 5\' 6"  (1.676 m), weight 151 lb (  68.493 kg), last menstrual period 03/16/2015.  Physical Examination: General appearance - alert, well appearing, and in no distress Pelvic - normal external genitalia, vulva, vagina, cervix, uterus and adnexa,  VULVA: normal appearing vulva with no masses, tenderness or lesions,  VAGINA: normal appearing vagina with normal color and discharge, no lesions, CERVIX: normal appearing cervix without discharge or lesions,  UTERUS: retroverted, small, mobile, non tender  ADNEXA: normal adnexa in size, nontender and no masses,  PAP: Pap smear done today Rectal - normal rectal, no masses   Assessment & Plan:   A:  1. Follow up of ASCUS pap 2. Borderline personality (accusatory speech patterns) 3. Cystocele, stable 4. 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 Normal adnexal exam, no reaction to bimanual exam.   P:  1. PAP smear performed will inform pt by phone 2. Pt encouraged to follow up with provider of choice as she refuses to  consider going back to [facility] 3.  I personally performed the services described in this documentation, which was SCRIBED in my presence. The recorded information has been reviewed and considered accurate. It has been edited as necessary during review. Jonnie Kind, MD

## 2015-04-14 NOTE — Addendum Note (Signed)
Addended by: Farley Ly on: 04/14/2015 03:27 PM   Modules accepted: Orders

## 2015-04-14 NOTE — Progress Notes (Signed)
Patient ID: Chloe Joyce, female   DOB: Oct 08, 1970, 44 y.o.   MRN: [id number] Pt here today for ovarian cyst and dysmenorrhea.

## 2015-04-19 LAB — CYTOLOGY - PAP

## 2015-08-04 ENCOUNTER — Ambulatory Visit (HOSPITAL_COMMUNITY): Payer: Medicaid Other | Admitting: Physical Therapy

## 2015-08-12 ENCOUNTER — Ambulatory Visit (INDEPENDENT_AMBULATORY_CARE_PROVIDER_SITE_OTHER): Payer: Medicaid Other | Admitting: Otolaryngology

## 2015-08-12 DIAGNOSIS — H903 Sensorineural hearing loss, bilateral: Secondary | ICD-10-CM

## 2015-08-26 ENCOUNTER — Ambulatory Visit (HOSPITAL_COMMUNITY): Payer: Medicaid Other | Attending: Sports Medicine | Admitting: Physical Therapy

## 2015-08-26 DIAGNOSIS — M545 Low back pain, unspecified: Secondary | ICD-10-CM

## 2015-08-26 NOTE — Patient Instructions (Signed)
Backward Bend (Standing)    Arch backward to make hollow of back deeper. Hold __2__ seconds. Repeat _5___ times per set. Do __1__ sets per session. Do _2___ sessions per day.  http://orth.exer.us/178   Copyright  VHI. All rights reserved.  Hamstring Stretch: Active    Support behind right knee. Starting with knee bent, attempt to straighten knee until a comfortable stretch is felt in back of thigh. Hold __30_ seconds. Repeat ___1_ times per set. Do _1___ sets per session. Do ___2_ sessions per day.  http://orth.exer.us/158   Copyright  VHI. All rights reserved.  Isometric Abdominal    Lying on back with knees bent, tighten stomach by pressing elbows down. Hold _3___ seconds. Repeat _10___ times per set. Do _1___ sets per session. Do ___4_ sessions per day.  http://orth.exer.us/1086   Copyright  VHI. All rights reserved.  Bridging    Slowly raise buttocks from floor, keeping stomach tight. Repeat _10___ times per set. Do _1___ sets per session. Do __2__ sessions per day.  http://orth.exer.us/1096   Copyright  VHI. All rights reserved.  Bent Leg Lift (Hook-Lying)    Tighten s10tomach and slowly raise right leg __2__ inches from floor. Keep trunk rigid. Hold __2__ seconds. Repeat __10__ times per set. Do _1___ sets per session. Do __2__ sessions per day.  http://orth.exer.us/1090   Copyright  VHI. All rights reserved.  Straight Leg Raise (Prone)    Abdomen and head supported, keep left knee locked and raise leg at hip. Avoid arching low back. Repeat _10___ times per set. Do ___1_ sets per session. Do __2__ sessions per day.  http://orth.exer.us/1112   Copyright  VHI. All rights reserved.

## 2015-08-26 NOTE — Therapy (Signed)
[facility] [address] [place], [place], [address] Phone: [phone]   Fax:  [phone]  Physical Therapy Evaluation  Patient Details  Name: Chloe Joyce MRN: [id number] Date of Birth: 24-Dec-1970 Referring Provider: Marlon Pel   Encounter Date: 08/26/2015      PT End of Session - 08/26/15 1517    Visit Number 1   Number of Visits 1   Authorization Type medicaid    PT Start Time 8938  Pt 15 minutes late for appointment    PT Stop Time 1515   PT Time Calculation (min) 30 min   Activity Tolerance Patient tolerated treatment well      Past Medical History  Diagnosis Date  . Anxiety   . Asthma   . Hyperlipidemia   . Back pain   . Hypothyroidism     Past Surgical History  Procedure Laterality Date  . Tubal ligation    . Tonsillectomy and adenoidectomy  1988  . Shoulder surgery Right   . Cesarean section    . Hemorroidectomy    . Cholecystectomy    . Tonsillectomy and adenoidectomy      There were no vitals filed for this visit.  Visit Diagnosis:  Right-sided low back pain without sciatica      Subjective Assessment - 08/26/15 1446    Subjective Pt states that she has had low back pain mostly on the right side and in her right lower abdominal area.  She states the MD's have no idea what is going on and has been referred to physical therapy. She has been hurting for 2-3 years and the pain is getting worse.  Pt states her pain is aggrevated when she is on her period as well as when she has a full bladder    How long can you sit comfortably? sitting sometimes aggrevates her other times it doesn't    How long can you stand comfortably? able to stand    Currently in Pain? Yes   Pain Score 4   can get as high as a 10.    Pain Location Back   Pain Orientation Right;Lower   Pain Type Chronic pain   Aggravating Factors  period, full bladder.    Multiple Pain Sites Yes   Pain Score 5   Pain Location Abdomen   Pain  Orientation Right   Pain Descriptors / Indicators Aching   Pain Type Chronic pain   Pain Frequency Intermittent            [facility] PT Assessment - 08/26/15 0001    Assessment   Medical Diagnosis lumbar pain    Referring Provider Marlon Pel    Onset Date/Surgical Date 03/26/15   Prior Therapy none   Precautions   Precautions None   Restrictions   Weight Bearing Restrictions No   Balance Screen   Has the patient fallen in the past 6 months No   Has the patient had a decrease in activity level because of a fear of falling?  No   Is the patient reluctant to leave their home because of a fear of falling?  No   Home Ecologist residence   Prior Function   Level of Independence Independent   Vocation Full time employment   Vocation Requirements breeds dogs    Cognition   Overall Cognitive Status Within Functional Limits for tasks assessed   Posture/Postural Control   Posture/Postural Control Postural limitations   Postural Limitations Rounded Shoulders;Increased  lumbar lordosis;Increased thoracic kyphosis   ROM / Strength   AROM / PROM / Strength AROM;Strength   AROM   AROM Assessment Site Lumbar   Lumbar Flexion decreased 30% with reps causing increased pain.l   Lumbar Extension decreased 60% reps no change    Lumbar - Right Side Bend decreased 30% reps no change   Lumbar - Left Side Bend decreased 30% reps no change    Strength   Strength Assessment Site Hip;Knee;Ankle   Right/Left Hip Right;Left   Right Hip Flexion 5/5   Right Hip Extension 3/5   Left Hip Flexion 4/5   Left Hip Extension 3/5   Right/Left Knee Right;Left   Left Knee Extension 4+/5   Right/Left Ankle Right;Left                           PT Education - 08/26/15 1517    Education provided Yes   Education Details HEP   Person(s) Educated Patient   Methods Explanation          PT Short Term Goals - 08/26/15 1544    PT SHORT TERM GOAL #1   Title Pt  to understand the importance of core mm strength for back stability.    Time 1   Period Days   Status Partially Met   PT SHORT TERM GOAL #2   Title Pt to be given a HEP to complete at home    Time 1   Period Days   Status Achieved                  Plan - 08/26/15 1541    Clinical Impression Statement Ms. Sabia is a 45 yo female who was referred for low back pain.  She has been experiencing the pain for years.  Evaluation demonstrates decreased core and LE strength and decreased lumbar ROM.  The pt insurance covers evaluation only and Ms. Reising states that she can not afford to come for any further treatments.  She  has been given a HEP and will continue her exercises on her own.    PT Frequency 1x / week   PT Duration --  1 week   PT Treatment/Interventions Therapeutic exercise   PT Next Visit Plan D/C to HEP          Problem List Patient Active Problem List   Diagnosis Date Noted  . Pap smear abnormality of cervix with ASCUS favoring benign 04/14/2015  . Chronic RLQ pain 10/29/2013  . Chronic pelvic pain in female 09/16/2013    Rayetta Humphrey, PT CLT [phone] 08/26/2015, 3:48 PM  [facility] [address] [place], [place], [address] Phone: [phone]   Fax:  [phone]  Name: EVANNA WASHINTON MRN: [id number] Date of Birth: 1971-01-16

## 2016-03-05 ENCOUNTER — Emergency Department (HOSPITAL_COMMUNITY): Payer: Medicaid Other

## 2016-03-05 ENCOUNTER — Encounter (HOSPITAL_COMMUNITY): Payer: Self-pay | Admitting: *Deleted

## 2016-03-05 ENCOUNTER — Emergency Department (HOSPITAL_COMMUNITY)
Admission: EM | Admit: 2016-03-05 | Discharge: 2016-03-06 | Disposition: A | Discharge status: Home / Self Care | Payer: Medicaid Other | Attending: Emergency Medicine | Admitting: Emergency Medicine

## 2016-03-05 DIAGNOSIS — S40211A Abrasion of right shoulder, initial encounter: Secondary | ICD-10-CM | POA: Diagnosis not present

## 2016-03-05 DIAGNOSIS — Y999 Unspecified external cause status: Secondary | ICD-10-CM | POA: Diagnosis not present

## 2016-03-05 DIAGNOSIS — S0012XA Contusion of left eyelid and periocular area, initial encounter: Secondary | ICD-10-CM | POA: Diagnosis not present

## 2016-03-05 DIAGNOSIS — Y939 Activity, unspecified: Secondary | ICD-10-CM | POA: Insufficient documentation

## 2016-03-05 DIAGNOSIS — Y929 Unspecified place or not applicable: Secondary | ICD-10-CM | POA: Insufficient documentation

## 2016-03-05 DIAGNOSIS — F17210 Nicotine dependence, cigarettes, uncomplicated: Secondary | ICD-10-CM | POA: Insufficient documentation

## 2016-03-05 DIAGNOSIS — H5712 Ocular pain, left eye: Secondary | ICD-10-CM | POA: Diagnosis present

## 2016-03-05 DIAGNOSIS — E785 Hyperlipidemia, unspecified: Secondary | ICD-10-CM | POA: Diagnosis not present

## 2016-03-05 DIAGNOSIS — S0083XA Contusion of other part of head, initial encounter: Secondary | ICD-10-CM | POA: Insufficient documentation

## 2016-03-05 DIAGNOSIS — J45909 Unspecified asthma, uncomplicated: Secondary | ICD-10-CM | POA: Insufficient documentation
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
## 2016-03-06 MED ORDER — HYDROCODONE-ACETAMINOPHEN 5-325 MG PO TABS: 1.0000 | ORAL_TABLET | ORAL | Status: DC | PRN

## 2016-03-06 MED FILL — Hydrocodone-Acetaminophen Tab 5-325 MG: ORAL | Qty: 6 | Status: AC

## 2016-03-06 NOTE — Discharge Instructions (Signed)
Facial or Scalp Contusion ° A facial or scalp contusion is a deep bruise on the face or head. Contusions happen when an injury causes bleeding under the skin. Signs of bruising include pain, puffiness (swelling), and discolored skin. The contusion may turn blue, purple, or yellow. °HOME CARE °· Only take medicines as told by your doctor. °· Put ice on the injured area. °¨ Put ice in a plastic bag. °¨ Place a towel between your skin and the bag. °¨ Leave the ice on for 20 minutes, 2-3 times a day. °GET HELP IF: °· You have bite problems. °· You have pain when chewing. °· You are worried about your face not healing normally. °GET HELP RIGHT AWAY IF:  °· You have severe pain or a headache and medicine does not help. °· You are very tired or confused, or your personality changes. °· You throw up (vomit). °· You have a nosebleed that will not stop. °· You see two of everything (double vision) or have blurry vision. °· You have fluid coming from your nose or ear. °· You have problems walking or using your arms or legs. °MAKE SURE YOU:  °· Understand these instructions. °· Will watch your condition. °· Will get help right away if you are not doing well or get worse. °  °This information is not intended to replace advice given to you by your health care provider. Make sure you discuss any questions you have with your health care provider. °  °Document Released: 07/27/2011 Document Revised: 08/28/2014 Document Reviewed: 03/20/2013 °Elsevier Interactive Patient Education ©2016 [employer] Inc. ° °

## 2016-08-03 ENCOUNTER — Ambulatory Visit (INDEPENDENT_AMBULATORY_CARE_PROVIDER_SITE_OTHER): Payer: Medicaid Other | Admitting: Otolaryngology

## 2016-08-25 ENCOUNTER — Telehealth: Payer: Self-pay | Admitting: Obstetrics and Gynecology

## 2016-08-25 NOTE — Telephone Encounter (Signed)
Left message on VM that our office is now closed and for her to go the ER if she felt like she was bleeding too much and needed to be seen.

## 2016-08-25 NOTE — Telephone Encounter (Signed)
Pt called stating that she has been soaking up super max tampon every 3 hours. Pt would like a call back from the nurse to see what she could do. Please contact pt

## 2016-09-08 ENCOUNTER — Encounter: Payer: Self-pay | Admitting: Obstetrics & Gynecology

## 2016-09-08 ENCOUNTER — Other Ambulatory Visit (HOSPITAL_COMMUNITY)
Admission: RE | Admit: 2016-09-08 | Discharge: 2016-09-08 | Disposition: A | Discharge status: Home / Self Care | Payer: Medicaid Other | Source: Ambulatory Visit | Attending: Obstetrics & Gynecology | Admitting: Obstetrics & Gynecology

## 2016-09-08 ENCOUNTER — Ambulatory Visit (INDEPENDENT_AMBULATORY_CARE_PROVIDER_SITE_OTHER): Payer: Medicaid Other | Admitting: Obstetrics & Gynecology

## 2016-09-08 ENCOUNTER — Encounter (INDEPENDENT_AMBULATORY_CARE_PROVIDER_SITE_OTHER): Payer: Self-pay

## 2016-09-08 VITALS — BP 114/84 | Wt 173.0 lb

## 2016-09-08 DIAGNOSIS — Z01419 Encounter for gynecological examination (general) (routine) without abnormal findings: Secondary | ICD-10-CM | POA: Diagnosis present

## 2016-09-08 DIAGNOSIS — N938 Other specified abnormal uterine and vaginal bleeding: Secondary | ICD-10-CM | POA: Diagnosis not present

## 2016-09-08 DIAGNOSIS — Z113 Encounter for screening for infections with a predominantly sexual mode of transmission: Secondary | ICD-10-CM | POA: Insufficient documentation

## 2016-09-08 DIAGNOSIS — N946 Dysmenorrhea, unspecified: Secondary | ICD-10-CM | POA: Diagnosis not present

## 2016-09-08 DIAGNOSIS — Z124 Encounter for screening for malignant neoplasm of cervix: Secondary | ICD-10-CM

## 2016-09-08 DIAGNOSIS — Z1151 Encounter for screening for human papillomavirus (HPV): Secondary | ICD-10-CM | POA: Diagnosis not present

## 2016-09-08 DIAGNOSIS — N921 Excessive and frequent menstruation with irregular cycle: Secondary | ICD-10-CM | POA: Diagnosis not present

## 2016-09-08 LAB — POCT HEMOGLOBIN: Hemoglobin: 11.5 g/dL — AB (ref 12.2–16.2)

## 2016-09-08 MED ORDER — MEGESTROL ACETATE 40 MG PO TABS: ORAL_TABLET | ORAL | 3 refills | Status: DC

## 2016-09-08 NOTE — Addendum Note (Signed)
Addended by: Octaviano Glow on: 09/08/2016 03:16 PM   Modules accepted: Orders

## 2016-09-08 NOTE — Progress Notes (Signed)
Chief Complaint  Patient presents with  . Menorrhagia    5 weeks on and off    Blood pressure 114/84, weight 173 lb (78.5 kg), last menstrual period 08/02/2016.  46 y.o. No obstetric history on file. Patient's last menstrual period was 08/02/2016. The current method of family planning is tubal ligation.  Outpatient Encounter Prescriptions as of 09/08/2016  Medication Sig  . albuterol (PROVENTIL HFA;VENTOLIN HFA) 108 (90 BASE) MCG/ACT inhaler Inhale 1-2 puffs into the lungs every 6 (six) hours as needed for wheezing or shortness of breath.  . ALPRAZolam (XANAX) 1 MG tablet Take 1 mg by mouth 3 (three) times daily.  . cetirizine (ZYRTEC) 10 MG tablet Take 10 mg by mouth as needed for allergies.  . cyclobenzaprine (FLEXERIL) 10 MG tablet Take 1 tablet (10 mg total) by mouth 2 (two) times daily as needed for muscle spasms. (Patient taking differently: Take 10 mg by mouth 3 (three) times daily as needed for muscle spasms. )  . furosemide (LASIX) 20 MG tablet Take 20 mg by mouth daily as needed for fluid.  . simvastatin (ZOCOR) 40 MG tablet Take 40 mg by mouth every evening.   . theophylline (THEO-24) 300 MG 24 hr capsule Take 1 capsule (300 mg total) by mouth 3 (three) times daily.  . traMADol (ULTRAM) 50 MG tablet Take 50 mg by mouth every 6 (six) hours as needed for moderate pain or severe pain.   Marland Kitchen HYDROcodone-acetaminophen (NORCO/VICODIN) 5-325 MG tablet Take 1-2 tablets by mouth every 4 (four) hours as needed. (Patient not taking: Reported on 09/08/2016)  . ibuprofen (ADVIL,MOTRIN) 800 MG tablet Take 1 tablet (800 mg total) by mouth every 8 (eight) hours as needed. (Patient not taking: Reported on 09/08/2016)  . megestrol (MEGACE) 40 MG tablet 3 tablets a day for 5 days, 2 tablets a day for 5 days then 1 tablet daily   No facility-administered encounter medications on file as of 09/08/2016.     Subjective Pt with 5-6 weeks of bleeding sometimes spotting sometimes heavy Some  cramping but not significant Had trouble with bleeding before and with dysmenorrhea   Objective General WDWN female NAD Vulva:  normal appearing vulva with no masses, tenderness or lesions Vagina:  normal mucosa, scant blood Cervix:  no bleeding following Pap, no cervical motion tenderness and no lesions Uterus:  normal size, contour, position, consistency, mobility, non-tenderretroverted Adnexa: ovaries:present,  Non tender   Pertinent ROS No burning with urination, frequency or urgency No nausea, vomiting or diarrhea Nor fever chills or other constitutional symptoms   Labs or studies Hemoglobin 11.5    Impression Diagnoses this Encounter::   ICD-9-CM ICD-10-CM   1. Menometrorrhagia 626.2 N92.1 US Pelvis Complete     US Transvaginal Non-OB    Established relevant diagnosis(es):   Plan/Recommendations: Meds ordered this encounter  Medications  . megestrol (MEGACE) 40 MG tablet    Sig: 3 tablets a day for 5 days, 2 tablets a day for 5 days then 1 tablet daily    Dispense:  45 tablet    Refill:  3    Labs or Scans Ordered: Orders Placed This Encounter  Procedures  . US Pelvis Complete  . US Transvaginal Non-OB    Management:: Megace algorithm sonogram for anatomical evaluation  Follow up Return in about 2 weeks (around 09/22/2016) for GYN sono, with Dr Elonda Husky.      All questions were answered.  Past Medical History:  Diagnosis Date  . Anxiety   .  Asthma   . Back pain   . Hyperlipidemia     Past Surgical History:  Procedure Laterality Date  . CESAREAN SECTION    . CHOLECYSTECTOMY    . HEMORROIDECTOMY    . SHOULDER SURGERY Right   . TONSILLECTOMY AND ADENOIDECTOMY  1988  . TONSILLECTOMY AND ADENOIDECTOMY    . TUBAL LIGATION      OB History    No data available      No Known Allergies  Social History   Social History  . Marital status: Single    Spouse name: N/A  . Number of children: N/A  . Years of education: N/A   Social History  Main Topics  . Smoking status: Current Every Day Smoker    Packs/day: 1.00    Years: 20.00    Types: Cigarettes  . Smokeless tobacco: Never Used  . Alcohol use Yes  . Drug use: No  . Sexual activity: Yes    Birth control/ protection: Surgical     Comment: tubal   Other Topics Concern  . None   Social History Narrative  . None    Family History  Problem Relation Age of Onset  . Hypertension Mother   . Cancer Mother     breast  . Stroke Mother   . Hyperlipidemia Mother   . Varicose Veins Mother   . Miscarriages / [place] Mother   . Asthma Brother   . Diabetes Maternal Grandmother   . Heart disease Other

## 2016-09-08 NOTE — Addendum Note (Signed)
Addended by: Octaviano Glow on: 09/08/2016 03:59 PM   Modules accepted: Orders

## 2016-09-12 LAB — CYTOLOGY - PAP
Chlamydia: NEGATIVE
Diagnosis: NEGATIVE
HPV: NOT DETECTED
Neisseria Gonorrhea: NEGATIVE

## 2016-09-25 ENCOUNTER — Other Ambulatory Visit: Payer: Medicaid Other

## 2016-09-26 ENCOUNTER — Other Ambulatory Visit: Payer: Self-pay | Admitting: Obstetrics & Gynecology

## 2016-09-26 ENCOUNTER — Ambulatory Visit (INDEPENDENT_AMBULATORY_CARE_PROVIDER_SITE_OTHER): Payer: Medicaid Other

## 2016-09-26 ENCOUNTER — Ambulatory Visit (INDEPENDENT_AMBULATORY_CARE_PROVIDER_SITE_OTHER): Payer: Medicaid Other | Admitting: Obstetrics & Gynecology

## 2016-09-26 ENCOUNTER — Encounter: Payer: Self-pay | Admitting: Obstetrics & Gynecology

## 2016-09-26 VITALS — BP 120/80 | HR 92 | Wt 172.0 lb

## 2016-09-26 DIAGNOSIS — N921 Excessive and frequent menstruation with irregular cycle: Secondary | ICD-10-CM

## 2016-09-26 DIAGNOSIS — N83202 Unspecified ovarian cyst, left side: Secondary | ICD-10-CM

## 2016-09-26 DIAGNOSIS — N83292 Other ovarian cyst, left side: Secondary | ICD-10-CM

## 2016-09-26 DIAGNOSIS — N854 Malposition of uterus: Secondary | ICD-10-CM

## 2016-09-26 NOTE — Progress Notes (Signed)
PELVIC TA/TV:homogeneous retroverted uterus,wnl,EEC 10.9 mm,normal right ovary,simple left ovarian cyst 4.3 x 3.7 x 3.1 cm w/arterial and venous flow,prominent vessels left adnexa 8.5 mm

## 2016-09-26 NOTE — Progress Notes (Signed)
Follow up appointment for results  Chief Complaint  Patient presents with  . Follow-up    ultrasound    Blood pressure 120/80, pulse 92, weight 172 lb (78 kg).  US Transvaginal Non-ob  Result Date: 09/26/2016 GYNECOLOGIC SONOGRAM MIRCA MYNATT is a 46 y.o. unknown LMP for a pelvic sonogram for menometrorrhagia w/LLQ pain. Uterus                      7.34 x 6.5 x 5.3 cm, homogeneous retroverted uterus,wnl Endometrium          10.9 mm, symmetrical, wnl Right ovary             2.1 x 2 x 1.9 cm, wnl Left ovary                4.4 x 4.6 x 3.2 cm, simple left ovarian cyst 4.3 x 3.7 x 3.1 cm w/arterial and venous flow,prominent vessels left adnexa 8.5 mm No free fluid Technician Comments: PELVIC TA/TV:homogeneous retroverted uterus,wnl,EEC 10.9 mm,normal right ovary,simple left ovarian cyst 4.3 x 3.7 x 3.1 cm w/arterial and venous flow,prominent vessels left adnexa 8.5 mm,left adnexal pain during ultrasound,ovaries appear mobile [employer] 09/26/2016 4:48 PM Clinical Impression and recommendations: I have reviewed the sonogram results above, combined with the patient's current clinical course, below are my impressions and any appropriate recommendations for management based on the sonographic findings. Uterus and endometrium normal Right ovary normal Left ovary with simple cyst, not related to her bleeding but probably is to the LLQ pain Charda Janis H 09/26/2016 4:52 PM   US Pelvis Complete  Result Date: 09/26/2016 GYNECOLOGIC SONOGRAM TEREKA LAAKE is a 46 y.o. unknown LMP for a pelvic sonogram for menometrorrhagia w/LLQ pain. Uterus                      7.34 x 6.5 x 5.3 cm, homogeneous retroverted uterus,wnl Endometrium          10.9 mm, symmetrical, wnl Right ovary             2.1 x 2 x 1.9 cm, wnl Left ovary                4.4 x 4.6 x 3.2 cm, simple left ovarian cyst 4.3 x 3.7 x 3.1 cm w/arterial and venous flow,prominent vessels left adnexa 8.5 mm No free fluid Technician Comments: PELVIC  TA/TV:homogeneous retroverted uterus,wnl,EEC 10.9 mm,normal right ovary,simple left ovarian cyst 4.3 x 3.7 x 3.1 cm w/arterial and venous flow,prominent vessels left adnexa 8.5 mm,left adnexal pain during ultrasound,ovaries appear mobile [employer] 09/26/2016 4:48 PM Clinical Impression and recommendations: I have reviewed the sonogram results above, combined with the patient's current clinical course, below are my impressions and any appropriate recommendations for management based on the sonographic findings. Uterus and endometrium normal Right ovary normal Left ovary with simple cyst, not related to her bleeding but probably is to the LLQ pain Etty Isaac H 09/26/2016 4:52 PM   [place] Art/ven Flow Abd Pelv Doppler Limited  Result Date: 09/26/2016 GYNECOLOGIC SONOGRAM CHAUNA LASHLEY is a 46 y.o. unknown LMP for a pelvic sonogram for menometrorrhagia w/LLQ pain. Uterus                      7.34 x 6.5 x 5.3 cm, homogeneous retroverted uterus,wnl Endometrium          10.9 mm, symmetrical, wnl Right ovary  2.1 x 2 x 1.9 cm, wnl Left ovary                4.4 x 4.6 x 3.2 cm, simple left ovarian cyst 4.3 x 3.7 x 3.1 cm w/arterial and venous flow,prominent vessels left adnexa 8.5 mm No free fluid Technician Comments: PELVIC TA/TV:homogeneous retroverted uterus,wnl,EEC 10.9 mm,normal right ovary,simple left ovarian cyst 4.3 x 3.7 x 3.1 cm w/arterial and venous flow,prominent vessels left adnexa 8.5 mm,left adnexal pain during ultrasound,ovaries appear mobile [employer] 09/26/2016 4:48 PM Clinical Impression and recommendations: I have reviewed the sonogram results above, combined with the patient's current clinical course, below are my impressions and any appropriate recommendations for management based on the sonographic findings. Uterus and endometrium normal Right ovary normal Left ovary with simple cyst, not related to her bleeding but probably is to the LLQ pain Kaidence Sant H 09/26/2016 4:52 PM      MEDS  ordered this encounter: No orders of the defined types were placed in this encounter.   Orders for this encounter: No orders of the defined types were placed in this encounter.   Impression: Menometrorrhagia  Cyst of left ovary    Plan: Pt comtemplating options, megestrol vs ablation +/- removal of left ovary Considering TVHBSO  Diona Foley is in patient's court  Follow Up: No Follow-up on file.       Face to face time:  15 minutes  Greater than 50% of the visit time was spent in counseling and coordination of care with the patient.  The summary and outline of the counseling and care coordination is summarized in the note above.   All questions were answered.  Past Medical History:  Diagnosis Date  . Anxiety   . Asthma   . Back pain   . Hyperlipidemia     Past Surgical History:  Procedure Laterality Date  . CESAREAN SECTION    . CHOLECYSTECTOMY    . HEMORROIDECTOMY    . SHOULDER SURGERY Right   . TONSILLECTOMY AND ADENOIDECTOMY  1988  . TONSILLECTOMY AND ADENOIDECTOMY    . TUBAL LIGATION      OB History    No data available      No Known Allergies  Social History   Social History  . Marital status: Single    Spouse name: N/A  . Number of children: N/A  . Years of education: N/A   Social History Main Topics  . Smoking status: Current Every Day Smoker    Packs/day: 1.00    Years: 20.00    Types: Cigarettes  . Smokeless tobacco: Never Used  . Alcohol use Yes  . Drug use: No  . Sexual activity: Yes    Birth control/ protection: Surgical     Comment: tubal   Other Topics Concern  . None   Social History Narrative  . None    Family History  Problem Relation Age of Onset  . Hypertension Mother   . Cancer Mother     breast  . Stroke Mother   . Hyperlipidemia Mother   . Varicose Veins Mother   . Miscarriages / [place] Mother   . Brain cancer Mother   . Asthma Brother   . Diabetes Maternal Grandmother   . Heart disease Other

## 2016-10-06 ENCOUNTER — Telehealth: Payer: Self-pay | Admitting: Obstetrics & Gynecology

## 2016-10-06 NOTE — Telephone Encounter (Signed)
Left message x 1. JSY 

## 2016-10-09 NOTE — Telephone Encounter (Signed)
Patient called stating she refilled her Megace and wanted to make sure she was taking it correctly. Informed patient that she is just to take 1 tablet daily. Pt stated that was how she was taking it and has not had any bleeding.

## 2016-10-09 NOTE — Telephone Encounter (Signed)
Left message x 2. JSY 

## 2016-10-24 ENCOUNTER — Telehealth: Payer: Self-pay | Admitting: *Deleted

## 2016-10-24 NOTE — Telephone Encounter (Signed)
Patient has some questions regarding your last conversation with options. Please call.

## 2016-10-26 ENCOUNTER — Telehealth: Payer: Self-pay | Admitting: Obstetrics & Gynecology

## 2016-10-26 NOTE — Telephone Encounter (Signed)
Pt needs to make appt

## 2016-10-26 NOTE — Telephone Encounter (Signed)
Pt called stating that she is in a lot of pain and is bleeding a lot and states that she was placed on a medication to stop the bleeding. Please contact pt

## 2016-10-27 NOTE — Telephone Encounter (Signed)
No answer @ 9:43 am. JSY

## 2016-10-27 NOTE — Telephone Encounter (Signed)
LMOVM for patient to return call. Needs to schedule appt with Dr Elonda Husky to discuss surgery/pre-op.

## 2016-10-31 ENCOUNTER — Telehealth: Payer: Self-pay | Admitting: *Deleted

## 2016-10-31 NOTE — Telephone Encounter (Signed)
Spoke with pt letting her know she needs to schedule an appt with Dr. Elonda Husky to discuss surgery and schedule. Pt has appt scheduled for 3/22. Pt states she is still bleeding. Taking Megace 3 daily. I spoke with Dr. Elonda Husky and he advised to keep taking 3 daily and keep appt for 3/22. Pt voiced understanding. Chloe Joyce

## 2016-11-09 ENCOUNTER — Encounter (INDEPENDENT_AMBULATORY_CARE_PROVIDER_SITE_OTHER): Payer: Self-pay

## 2016-11-09 ENCOUNTER — Encounter: Payer: Self-pay | Admitting: Obstetrics & Gynecology

## 2016-11-09 ENCOUNTER — Ambulatory Visit (INDEPENDENT_AMBULATORY_CARE_PROVIDER_SITE_OTHER): Payer: Medicaid Other | Admitting: Obstetrics & Gynecology

## 2016-11-09 VITALS — BP 128/78 | HR 72 | Ht 66.0 in | Wt 179.0 lb

## 2016-11-09 DIAGNOSIS — N946 Dysmenorrhea, unspecified: Secondary | ICD-10-CM | POA: Diagnosis not present

## 2016-11-09 DIAGNOSIS — N921 Excessive and frequent menstruation with irregular cycle: Secondary | ICD-10-CM | POA: Diagnosis not present

## 2016-11-09 DIAGNOSIS — N83202 Unspecified ovarian cyst, left side: Secondary | ICD-10-CM | POA: Diagnosis not present

## 2016-11-09 MED ORDER — MEGESTROL ACETATE 40 MG PO TABS: ORAL_TABLET | ORAL | 3 refills | Status: DC

## 2016-11-09 NOTE — Progress Notes (Signed)
Preoperative History and Physical  Chloe Joyce is a 46 y.o. No obstetric history on file. with No LMP recorded. admitted for a TVH BS) 11/22/2016.  Pt has chronic long standing heavy periods which have worsened over the past few months. Her dysmenorrhea has also worsened along with the volume of bleeding. Sonogram revelas cyst of the left ovary otheerwise normal probably adenomyosis.  We discussed various options and have had her on megestrol now for 2+ months with minimal success at present which makes it less likely she will respond to an ablation.  Initially she wanted to take a "wait and see" approach but has now decided to proceed with definitive surgical management  PMH:    Past Medical History:  Diagnosis Date  . Anxiety   . Asthma   . Back pain   . Hyperlipidemia     PSH:     Past Surgical History:  Procedure Laterality Date  . CESAREAN SECTION    . CHOLECYSTECTOMY    . HEMORROIDECTOMY    . SHOULDER SURGERY Right   . TONSILLECTOMY AND ADENOIDECTOMY  1988  . TONSILLECTOMY AND ADENOIDECTOMY    . TUBAL LIGATION      POb/GynH:      OB History    No data available      SH:   Social History  Substance Use Topics  . Smoking status: Current Every Day Smoker    Packs/day: 1.00    Years: 20.00    Types: Cigarettes  . Smokeless tobacco: Never Used  . Alcohol use Yes    FH:    Family History  Problem Relation Age of Onset  . Hypertension Mother   . Cancer Mother     breast  . Stroke Mother   . Hyperlipidemia Mother   . Varicose Veins Mother   . Miscarriages / [place] Mother   . Brain cancer Mother   . Asthma Brother   . Hypothyroidism Son   . Diabetes Maternal Grandmother   . Heart disease Other      Allergies: No Known Allergies  Medications:       Current Outpatient Prescriptions:  .  albuterol (PROVENTIL HFA;VENTOLIN HFA) 108 (90 BASE) MCG/ACT inhaler, Inhale 1-2 puffs into the lungs every 6 (six) hours as needed for wheezing or shortness of  breath., Disp: 1 Inhaler, Rfl: 0 .  ALPRAZolam (XANAX) 1 MG tablet, Take 1 mg by mouth 3 (three) times daily., Disp: , Rfl:  .  hydrOXYzine (VISTARIL) 50 MG capsule, Take 50 mg by mouth 3 (three) times daily as needed., Disp: , Rfl:  .  loratadine (CLARITIN) 10 MG tablet, Take 10 mg by mouth daily., Disp: , Rfl:  .  megestrol (MEGACE) 40 MG tablet, 3 tablets a day for 5 days, 2 tablets a day for 5 days then 1 tablet daily, Disp: 45 tablet, Rfl: 3 .  montelukast (SINGULAIR) 10 MG tablet, Take 10 mg by mouth at bedtime., Disp: , Rfl:  .  simvastatin (ZOCOR) 40 MG tablet, Take 40 mg by mouth every evening. , Disp: , Rfl:  .  theophylline (THEO-24) 300 MG 24 hr capsule, Take 1 capsule (300 mg total) by mouth 3 (three) times daily., Disp: 60 capsule, Rfl: 1 .  traMADol (ULTRAM) 50 MG tablet, Take 50 mg by mouth every 6 (six) hours as needed for moderate pain or severe pain. , Disp: , Rfl:  .  cetirizine (ZYRTEC) 10 MG tablet, Take 10 mg by mouth as needed for allergies., Disp: ,  Rfl:  .  HYDROcodone-acetaminophen (NORCO/VICODIN) 5-325 MG tablet, Take 1-2 tablets by mouth every 4 (four) hours as needed. (Patient not taking: Reported on 11/09/2016), Disp: 6 tablet, Rfl: 0  Review of Systems:   Review of Systems  Constitutional: Negative for fever, chills, weight loss, malaise/fatigue and diaphoresis.  HENT: Negative for hearing loss, ear pain, nosebleeds, congestion, sore throat, neck pain, tinnitus and ear discharge.   Eyes: Negative for blurred vision, double vision, photophobia, pain, discharge and redness.  Respiratory: Negative for cough, hemoptysis, sputum production, shortness of breath, wheezing and stridor.   Cardiovascular: Negative for chest pain, palpitations, orthopnea, claudication, leg swelling and PND.  Gastrointestinal: Positive for abdominal pain. Negative for heartburn, nausea, vomiting, diarrhea, constipation, blood in stool and melena.  Genitourinary: Negative for dysuria, urgency,  frequency, hematuria and flank pain.  Musculoskeletal: Negative for myalgias, back pain, joint pain and falls.  Skin: Negative for itching and rash.  Neurological: Negative for dizziness, tingling, tremors, sensory change, speech change, focal weakness, seizures, loss of consciousness, weakness and headaches.  Endo/Heme/Allergies: Negative for environmental allergies and polydipsia. Does not bruise/bleed easily.  Psychiatric/Behavioral: Negative for depression, suicidal ideas, hallucinations, memory loss and substance abuse. The patient is not nervous/anxious and does not have insomnia.      PHYSICAL EXAM:  Blood pressure 128/78, pulse 72, height 5\' 6"  (1.676 m), weight 179 lb (81.2 kg).    Vitals reviewed. Constitutional: She is oriented to person, place, and time. She appears well-developed and well-nourished.  HENT:  Head: Normocephalic and atraumatic.  Right Ear: External ear normal.  Left Ear: External ear normal.  Nose: Nose normal.  Mouth/Throat: Oropharynx is clear and moist.  Eyes: Conjunctivae and EOM are normal. Pupils are equal, round, and reactive to light. Right eye exhibits no discharge. Left eye exhibits no discharge. No scleral icterus.  Neck: Normal range of motion. Neck supple. No tracheal deviation present. No thyromegaly present.  Cardiovascular: Normal rate, regular rhythm, normal heart sounds and intact distal pulses.  Exam reveals no gallop and no friction rub.   No murmur heard. Respiratory: Effort normal and breath sounds normal. No respiratory distress. She has no wheezes. She has no rales. She exhibits no tenderness.  GI: Soft. Bowel sounds are normal. She exhibits no distension and no mass. There is tenderness. There is no rebound and no guarding.  Genitourinary:       Vulva is normal without lesions Vagina is pink moist without discharge Cervix normal in appearance and pap is normal Uterus is normal size, contour, position, consistency, mobility,  non-tender Adnexa is negative with normal sized ovaries by sonogram  Musculoskeletal: Normal range of motion. She exhibits no edema and no tenderness.  Neurological: She is alert and oriented to person, place, and time. She has normal reflexes. She displays normal reflexes. No cranial nerve deficit. She exhibits normal muscle tone. Coordination normal.  Skin: Skin is warm and dry. No rash noted. No erythema. No pallor.  Psychiatric: She has a normal mood and affect. Her behavior is normal. Judgment and thought content normal.    Labs: No results found for this or any previous visit (from the past 336 hour(s)).  EKG: No orders found for this or any previous visit.  Imaging Studies: No results found.    Assessment: Menometrorrhagia Dysmenorrhea Right lower quadrant pain   Patient Active Problem List   Diagnosis Date Noted  . Pap smear abnormality of cervix with ASCUS favoring benign 04/14/2015  . Chronic RLQ pain 10/29/2013  .  Chronic pelvic pain in female 09/16/2013    Plan: TVH BSO 11/22/2016  Pt understands the risks of surgery including but not limited t  excessive bleeding requiring transfusion or reoperation, post-operative infection requiring prolonged hospitalization or re-hospitalization and antibiotic therapy, and damage to other organs including bladder, bowel, ureters and major vessels.  The patient also understands the alternative treatment options which were discussed in full.  All questions were answered.  Izetta Sakamoto H 11/09/2016 4:44 PM   Everlee Quakenbush H 11/09/2016 4:44 PM

## 2016-11-13 NOTE — Patient Instructions (Signed)
Chloe Joyce  11/13/2016     @PREFPERIOPPHARMACY @   Your procedure is scheduled on  11/22/2016   Report to [facility] at  80  A.M.  Call this number if you have problems the morning of surgery:  [phone]   Remember:  Do not eat food or drink liquids after midnight.  Take these medicines the morning of surgery with A SIP OF WATER  Xanax, vistaril, claritin, theophylline, ultram.   Do not wear jewelry, make-up or nail polish.  Do not wear lotions, powders, or perfumes, or deoderant.  Do not shave 48 hours prior to surgery.  Men may shave face and neck.  Do not bring valuables to the hospital.  [facility] is not responsible for any belongings or valuables.  Contacts, dentures or bridgework may not be worn into surgery.  Leave your suitcase in the car.  After surgery it may be brought to your room.  For patients admitted to the hospital, discharge time will be determined by your treatment team.  Patients discharged the day of surgery will not be allowed to drive home.   Name and phone number of your driver:   family Special instructions:  None  Please read over the following fact sheets that you were given. Anesthesia Post-op Instructions and Care and Recovery After Surgery      Bilateral Salpingo-Oophorectomy Bilateral salpingo-oophorectomy is the surgical removal of both fallopian tubes and both ovaries. The ovaries are small organs that produce eggs in women. The fallopian tubes transport the egg from the ovary to the womb (uterus). Usually, when this surgery is done, the uterus was previously removed. A bilateral salpingo-oophorectomy may be done to treat cancer or to reduce the risk of cancer in women who are at high risk. Removing both fallopian tubes and both ovaries will make you unable to become pregnant (sterile). It will also put you into menopause so that you will no longer have menstrual periods and may have menopausal symptoms such as hot  flashes, night sweats, and mood changes. It will not affect your sex drive. Tell a health care provider about:  Any allergies you have.  All medicines you are taking, including vitamins, herbs, eye drops, creams, and over-the-counter medicines.  Previous problems you or family members have had with anesthetic medicines.  Any blood disorders you have.  Previous surgeries you have had.  Any medical conditions you have. What are the risks? Generally, this is a safe procedure. However, as with any procedure, complications can occur. Possible complications include:  Injury to surrounding organs.  Bleeding.  Infection.  Blood clots in the legs or lungs.  Problems related to anesthesia. What happens before the procedure?  Ask your health care provider about changing or stopping your regular medicines. You may need to stop taking certain medicines, such as aspirin or blood thinners, at least 1 week before the surgery.  Do not eat or drink anything for at least 8 hours before the surgery.  If you smoke, do not smoke for at least 2 weeks before the surgery.  Make plans to have someone drive you home after the procedure or after your hospital stay. Also arrange for someone to help you with activities during recovery. What happens during the procedure?  You will be given medicine to help you relax before the procedure (sedative). You will then be given medicine to make you sleep through the procedure (general anesthetic).  These medicines will be given through an IV access tube that is put into one of your veins.  Once you are asleep, your lower abdomen will be shaved and cleaned. A thin, flexible tube (catheter) will be placed in your bladder.  The surgeon may use a laparoscopic, robotic, or open technique for this surgery:  In the laparoscopic technique, the surgery is done through two small cuts (incisions) in the abdomen. A thin, lighted tube with a tiny camera on the end  (laparoscope) is inserted into one of the incisions. The tools needed for the procedure are put through the other incision.  A robotic technique may be chosen to perform complex surgery in a small space. In the robotic technique, small incisions will be made. A camera and surgical instruments are passed through the incisions. Surgical instruments will be controlled with the help of a robotic arm.  In the open technique, the surgery is done through one large incision in the abdomen.  Using any of these techniques, the surgeon removes the fallopian tubes and ovaries. The blood vessels will be clamped and tied.  The surgeon then uses staples or stitches to close the incision or incisions. What happens after the procedure?  You will be taken to a recovery area where you will be monitored for 1 to 3 hours. Your blood pressure, pulse, and temperature will be checked often. You will remain in the recovery area until you are stable and waking up.  If the laparoscopic technique was used, you may be allowed to go home after several hours. You may have some shoulder pain after the laparoscopic procedure. This is normal and usually goes away in a day or two.  If the open technique was used, you will be admitted to the hospital for a couple of days.  You will be given pain medicine as needed.  The IV access tube and catheter will be removed before you are discharged. This information is not intended to replace advice given to you by your health care provider. Make sure you discuss any questions you have with your health care provider. Document Released: 08/07/2005 Document Revised: 07/07/2016 Document Reviewed: 01/29/2013 Elsevier Interactive Patient Education  2017 [employer] Bilateral Salpingo-Oophorectomy, Care After Refer to this sheet in the next few weeks. These instructions provide you with information on caring for yourself after your procedure. Your health care provider may also give you more  specific instructions. Your treatment has been planned according to current medical practices, but problems sometimes occur. Call your health care provider if you have any problems or questions after your procedure. What can I expect after the procedure? After the procedure, it is typical to have the following:  Abdominal pain that can be controlled with medicine.  Vaginal spotting.  Constipation.  Menopausal symptoms such as hot flashes, vaginal dryness, and mood swings. Follow these instructions at home:  Get plenty of rest and sleep.  Only take over-the-counter or prescription medicines as directed by your health care provider. Do not take aspirin. It can cause bleeding.  Keep incision areas clean and dry. Remove or change bandages (dressings) only as directed by your health care provider.  Take showers instead of baths for a few weeks as directed by your health care provider.  Limit exercise and activities as directed by your health care provider. Do not lift anything heavier than 5 pounds (2.3 kg) until your health care provider approves.  Do not drive until your health care provider approves.  Follow your  health care provider's advice regarding diet. You may be able to resume your usual diet right away.  Drink enough fluids to keep your urine clear or pale yellow.  Do not douche, use tampons, or have sexual intercourse for 6 weeks after the procedure.  Do not drink alcohol until your health care provider says it is okay.  Take your temperature twice a day and write it down.  If you become constipated, you may:  Ask your health care provider about taking a mild laxative.  Add more fruit and bran to your diet.  Drink more fluids.  Follow up with your health care provider as directed. Contact a health care provider if:  You have swelling, redness, or increasing pain in the incision area.  You see pus coming from the incision area.  You notice a bad smell coming  from the wound or dressing.  You have pain, redness, or swelling where the IV access tube was placed.  Your incision is breaking open (the edges are not staying together).  You feel dizzy or feel like fainting.  You develop pain or bleeding when you urinate.  You develop diarrhea.  You develop nausea and vomiting.  You develop abnormal vaginal discharge.  You develop a rash.  You have pain that is not controlled with medicine. Get help right away if:  You develop a fever.  You develop abdominal pain.  You have chest pain.  You develop shortness of breath.  You pass out.  You develop pain, swelling, or redness in your leg.  You develop heavy vaginal bleeding with or without blood clots. This information is not intended to replace advice given to you by your health care provider. Make sure you discuss any questions you have with your health care provider. Document Released: 08/07/2005 Document Revised: 07/07/2016 Document Reviewed: 01/29/2013 Elsevier Interactive Patient Education  2017 [employer]  Vaginal Hysterectomy A vaginal hysterectomy is a procedure to remove all or part of the uterus through a small incision in the vagina. In this procedure, your health care provider may remove your entire uterus, including the lower end (cervix). You may need a vaginal hysterectomy to treat:  Uterine fibroids.  A condition that causes the lining of the uterus to grow in other areas (endometriosis).  Problems with pelvic support.  Cancer of the cervix, ovaries, uterus, or tissue that lines the uterus (endometrium).  Excessive (dysfunctional) uterine bleeding. When removing your uterus, your health care provider may also remove the organs that produce eggs (ovaries) and the tubes that carry eggs to your uterus (fallopian tubes). After a vaginal hysterectomy, you will no longer be able to have a baby. You will also no longer get your menstrual period. Tell a health care  provider about:  Any allergies you have.  All medicines you are taking, including vitamins, herbs, eye drops, creams, and over-the-counter medicines.  Any problems you or family members have had with anesthetic medicines.  Any blood disorders you have.  Any surgeries you have had.  Any medical conditions you have.  Whether you are pregnant or may be pregnant. What are the risks? Generally, this is a safe procedure. However, problems may occur, including:  Bleeding.  Infection.  A blood clot that forms in your leg and travels to your lungs (pulmonary embolism).  Damage to surrounding organs.  Pain during sex. What happens before the procedure?  Ask your health care provider what organs will be removed during surgery.  Ask your health care provider about:  Changing or stopping your regular medicines. This is especially important if you are taking diabetes medicines or blood thinners.  Taking medicines such as aspirin and ibuprofen. These medicines can thin your blood. Do not take these medicines before your procedure if your health care provider instructs you not to.  Follow instructions from your health care provider about eating or drinking restrictions.  Do not use any tobacco products, such as cigarettes, chewing tobacco, and e-cigarettes. If you need help quitting, ask your health care provider.  Plan to have someone take you home after discharge from the hospital. What happens during the procedure?  To reduce your risk of infection:  Your health care team will wash or sanitize their hands.  Your skin will be washed with soap.  An IV tube will be inserted into one of your veins.  You may be given antibiotic medicine to help prevent infection.  You will be given one or more of the following:  A medicine to help you relax (sedative).  A medicine to numb the area (local anesthetic).  A medicine to make you fall asleep (general anesthetic).  A medicine  that is injected into an area of your body to numb everything beyond the injection site (regional anesthetic).  Your surgeon will make an incision in your vagina.  Your surgeon will locate and remove all or part of your uterus.  Your ovaries and fallopian tubes may be removed at the same time.  The incision will be closed with stitches (sutures) that dissolve over time. The procedure may vary among health care providers and hospitals. What happens after the procedure?  Your blood pressure, heart rate, breathing rate, and blood oxygen level will be monitored often until the medicines you were given have worn off.  You will be encouraged to get up and walk around after a few hours to help prevent complications.  You may have IV tubes in place for a few days.  You will be given pain medicine as needed.  Do not drive for 24 hours if you were given a sedative. This information is not intended to replace advice given to you by your health care provider. Make sure you discuss any questions you have with your health care provider. Document Released: 11/29/2015 Document Revised: 01/13/2016 Document Reviewed: 08/22/2015 Elsevier Interactive Patient Education  2017 [place].  Vaginal Hysterectomy, Care After Refer to this sheet in the next few weeks. These instructions provide you with information about caring for yourself after your procedure. Your health care provider may also give you more specific instructions. Your treatment has been planned according to current medical practices, but problems sometimes occur. Call your health care provider if you have any problems or questions after your procedure. What can I expect after the procedure? After the procedure, it is common to have:  Pain.  Soreness and numbness in your incision areas.  Vaginal bleeding and discharge.  Constipation.  Temporary problems emptying the bladder.  Feelings of sadness or other emotions. Follow these  instructions at home: Medicines   Take over-the-counter and prescription medicines only as told by your health care provider.  If you were prescribed an antibiotic medicine, take it as told by your health care provider. Do not stop taking the antibiotic even if you start to feel better.  Do not drive or operate heavy machinery while taking prescription pain medicine. Activity   Return to your normal activities as told by your health care provider. Ask your health care provider what  activities are safe for you.  Get regular exercise as told by your health care provider. You may be told to take short walks every day and go farther each time.  Do not lift anything that is heavier than 10 lb (4.5 kg). General instructions    Do not put anything in your vagina for 6 weeks after your surgery or as told by your health care provider. This includes tampons and douches.  Do not have sex until your health care provider says you can.  Do not take baths, swim, or use a hot tub until your health care provider approves.  Drink enough fluid to keep your urine clear or pale yellow.  Do not drive for 24 hours if you were given a sedative.  Keep all follow-up visits as told by your health care provider. This is important. Contact a health care provider if:  Your pain medicine is not helping.  You have a fever.  You have redness, swelling, or pain at your incision site.  You have blood, pus, or a bad-smelling discharge from your vagina.  You continue to have difficulty urinating. Get help right away if:  You have severe abdominal or back pain.  You have heavy bleeding from your vagina.  You have chest pain or shortness of breath. This information is not intended to replace advice given to you by your health care provider. Make sure you discuss any questions you have with your health care provider. Document Released: 11/29/2015 Document Revised: 01/13/2016 Document Reviewed:  08/22/2015 Elsevier Interactive Patient Education  2017 [place] Anesthesia, Adult General anesthesia is the use of medicines to make a person "go to sleep" (be unconscious) for a medical procedure. General anesthesia is often recommended when a procedure:  Is long.  Requires you to be still or in an unusual position.  Is major and can cause you to lose blood.  Is impossible to do without general anesthesia. The medicines used for general anesthesia are called general anesthetics. In addition to making you sleep, the medicines:  Prevent pain.  Control your blood pressure.  Relax your muscles. Tell a health care provider about:  Any allergies you have.  All medicines you are taking, including vitamins, herbs, eye drops, creams, and over-the-counter medicines.  Any problems you or family members have had with anesthetic medicines.  Types of anesthetics you have had in the past.  Any bleeding disorders you have.  Any surgeries you have had.  Any medical conditions you have.  Any history of heart or lung conditions, such as heart failure, sleep apnea, or chronic obstructive pulmonary disease (COPD).  Whether you are pregnant or may be pregnant.  Whether you use tobacco, alcohol, marijuana, or street drugs.  Any history of Armed forces logistics/support/administrative officer.  Any history of depression or anxiety. What are the risks? Generally, this is a safe procedure. However, problems may occur, including:  Allergic reaction to anesthetics.  Lung and heart problems.  Inhaling food or liquids from your stomach into your lungs (aspiration).  Injury to nerves.  Waking up during your procedure and being unable to move (rare).  Extreme agitation or a state of mental confusion (delirium) when you wake up from the anesthetic.  Air in the bloodstream, which can lead to stroke. These problems are more likely to develop if you are having a major surgery or if you have an advanced medical  condition. You can prevent some of these complications by answering all of your health care provider's  questions thoroughly and by following all pre-procedure instructions. General anesthesia can cause side effects, including:  Nausea or vomiting  A sore throat from the breathing tube.  Feeling cold or shivery.  Feeling tired, washed out, or achy.  Sleepiness or drowsiness.  Confusion or agitation. What happens before the procedure? Staying hydrated  Follow instructions from your health care provider about hydration, which may include:  Up to 2 hours before the procedure - you may continue to drink clear liquids, such as water, clear fruit juice, black coffee, and plain tea. Eating and drinking restrictions  Follow instructions from your health care provider about eating and drinking, which may include:  8 hours before the procedure - stop eating heavy meals or foods such as meat, fried foods, or fatty foods.  6 hours before the procedure - stop eating light meals or foods, such as toast or cereal.  6 hours before the procedure - stop drinking milk or drinks that contain milk.  2 hours before the procedure - stop drinking clear liquids. Medicines   Ask your health care provider about:  Changing or stopping your regular medicines. This is especially important if you are taking diabetes medicines or blood thinners.  Taking medicines such as aspirin and ibuprofen. These medicines can thin your blood. Do not take these medicines before your procedure if your health care provider instructs you not to.  Taking new dietary supplements or medicines. Do not take these during the week before your procedure unless your health care provider approves them.  If you are told to take a medicine or to continue taking a medicine on the day of the procedure, take the medicine with sips of water. General instructions    Ask if you will be going home the same day, the following day, or after a  longer hospital stay.  Plan to have someone take you home.  Plan to have someone stay with you for the first 24 hours after you leave the hospital or clinic.  For 3-6 weeks before the procedure, try not to use any tobacco products, such as cigarettes, chewing tobacco, and e-cigarettes.  You may brush your teeth on the morning of the procedure, but make sure to spit out the toothpaste. What happens during the procedure?  You will be given anesthetics through a mask and through an IV tube in one of your veins.  You may receive medicine to help you relax (sedative).  As soon as you are asleep, a breathing tube may be used to help you breathe.  An anesthesia specialist will stay with you throughout the procedure. He or she will help keep you comfortable and safe by continuing to give you medicines and adjusting the amount of medicine that you get. He or she will also watch your blood pressure, pulse, and oxygen levels to make sure that the anesthetics do not cause any problems.  If a breathing tube was used to help you breathe, it will be removed before you wake up. The procedure may vary among health care providers and hospitals. What happens after the procedure?  You will wake up, often slowly, after the procedure is complete, usually in a recovery area.  Your blood pressure, heart rate, breathing rate, and blood oxygen level will be monitored until the medicines you were given have worn off.  You may be given medicine to help you calm down if you feel anxious or agitated.  If you will be going home the same day, your health care  provider may check to make sure you can stand, drink, and urinate.  Your health care providers will treat your pain and side effects before you go home.  Do not drive for 24 hours if you received a sedative.  You may:  Feel nauseous and vomit.  Have a sore throat.  Have mental slowness.  Feel cold or shivery.  Feel sleepy.  Feel tired.  Feel  sore or achy, even in parts of your body where you did not have surgery. This information is not intended to replace advice given to you by your health care provider. Make sure you discuss any questions you have with your health care provider. Document Released: 11/14/2007 Document Revised: 01/18/2016 Document Reviewed: 07/22/2015 Elsevier Interactive Patient Education  2017 [place] Anesthesia, Adult, Care After These instructions provide you with information about caring for yourself after your procedure. Your health care provider may also give you more specific instructions. Your treatment has been planned according to current medical practices, but problems sometimes occur. Call your health care provider if you have any problems or questions after your procedure. What can I expect after the procedure? After the procedure, it is common to have:  Vomiting.  A sore throat.  Mental slowness. It is common to feel:  Nauseous.  Cold or shivery.  Sleepy.  Tired.  Sore or achy, even in parts of your body where you did not have surgery. Follow these instructions at home: For at least 24 hours after the procedure:   Do not:  Participate in activities where you could fall or become injured.  Drive.  Use heavy machinery.  Drink alcohol.  Take sleeping pills or medicines that cause drowsiness.  Make important decisions or sign legal documents.  Take care of children on your own.  Rest. Eating and drinking   If you vomit, drink water, juice, or soup when you can drink without vomiting.  Drink enough fluid to keep your urine clear or pale yellow.  Make sure you have little or no nausea before eating solid foods.  Follow the diet recommended by your health care provider. General instructions   Have a responsible adult stay with you until you are awake and alert.  Return to your normal activities as told by your health care provider. Ask your health care  provider what activities are safe for you.  Take over-the-counter and prescription medicines only as told by your health care provider.  If you smoke, do not smoke without supervision.  Keep all follow-up visits as told by your health care provider. This is important. Contact a health care provider if:  You continue to have nausea or vomiting at home, and medicines are not helpful.  You cannot drink fluids or start eating again.  You cannot urinate after 8-12 hours.  You develop a skin rash.  You have fever.  You have increasing redness at the site of your procedure. Get help right away if:  You have difficulty breathing.  You have chest pain.  You have unexpected bleeding.  You feel that you are having a life-threatening or urgent problem. This information is not intended to replace advice given to you by your health care provider. Make sure you discuss any questions you have with your health care provider. Document Released: 11/13/2000 Document Revised: 01/10/2016 Document Reviewed: 07/22/2015 Elsevier Interactive Patient Education  2017 [employer].

## 2016-11-16 ENCOUNTER — Encounter (HOSPITAL_COMMUNITY)
Admission: RE | Admit: 2016-11-16 | Discharge: 2016-11-16 | Disposition: A | Discharge status: Home / Self Care | Payer: Medicaid Other | Source: Ambulatory Visit | Attending: Obstetrics & Gynecology | Admitting: Obstetrics & Gynecology

## 2016-11-16 ENCOUNTER — Other Ambulatory Visit: Payer: Self-pay | Admitting: Obstetrics & Gynecology

## 2016-11-20 ENCOUNTER — Encounter (HOSPITAL_COMMUNITY): Payer: Self-pay

## 2016-11-20 ENCOUNTER — Encounter (HOSPITAL_COMMUNITY)
Admission: RE | Admit: 2016-11-20 | Discharge: 2016-11-20 | Disposition: A | Discharge status: Home / Self Care | Payer: Medicaid Other | Source: Ambulatory Visit | Attending: Obstetrics & Gynecology | Admitting: Obstetrics & Gynecology

## 2016-11-20 DIAGNOSIS — Z01812 Encounter for preprocedural laboratory examination: Secondary | ICD-10-CM | POA: Insufficient documentation

## 2016-11-20 HISTORY — DX: Headache, unspecified: R51.9

## 2016-11-20 HISTORY — DX: Unspecified osteoarthritis, unspecified site: M19.90

## 2016-11-20 HISTORY — DX: Headache: R51

## 2016-11-20 LAB — SURGICAL PCR SCREEN
MRSA, PCR: NEGATIVE
Staphylococcus aureus: NEGATIVE

## 2016-11-20 LAB — URINALYSIS, ROUTINE W REFLEX MICROSCOPIC
Bilirubin Urine: NEGATIVE
Glucose, UA: NEGATIVE mg/dL
Ketones, ur: NEGATIVE mg/dL
Leukocytes, UA: NEGATIVE
Nitrite: NEGATIVE
Protein, ur: NEGATIVE mg/dL
Specific Gravity, Urine: 1.002 — ABNORMAL LOW (ref 1.005–1.030)
WBC, UA: NONE SEEN WBC/hpf (ref 0–5)
pH: 6 (ref 5.0–8.0)

## 2016-11-20 LAB — CBC
HCT: 41 % (ref 36.0–46.0)
Hemoglobin: 13.6 g/dL (ref 12.0–15.0)
MCH: 32.1 pg (ref 26.0–34.0)
MCHC: 33.2 g/dL (ref 30.0–36.0)
MCV: 96.7 fL (ref 78.0–100.0)
Platelets: 378 10*3/uL (ref 150–400)
RBC: 4.24 MIL/uL (ref 3.87–5.11)
RDW: 13.5 % (ref 11.5–15.5)
WBC: 7.3 10*3/uL (ref 4.0–10.5)

## 2016-11-20 LAB — RAPID HIV SCREEN (HIV 1/2 AB+AG)
HIV 1/2 Antibodies: NONREACTIVE
HIV-1 P24 Antigen - HIV24: NONREACTIVE

## 2016-11-20 LAB — COMPREHENSIVE METABOLIC PANEL
ALT: 105 U/L — ABNORMAL HIGH (ref 14–54)
AST: 37 U/L (ref 15–41)
Albumin: 4.3 g/dL (ref 3.5–5.0)
Alkaline Phosphatase: 88 U/L (ref 38–126)
Anion gap: 10 (ref 5–15)
BUN: 12 mg/dL (ref 6–20)
CO2: 24 mmol/L (ref 22–32)
Calcium: 9.4 mg/dL (ref 8.9–10.3)
Chloride: 103 mmol/L (ref 101–111)
Creatinine, Ser: 0.63 mg/dL (ref 0.44–1.00)
GFR calc Af Amer: >60 mL/min (ref >60)
GFR calc non Af Amer: >60 mL/min (ref >60)
Glucose, Bld: 86 mg/dL (ref 65–99)
Potassium: 3.5 mmol/L (ref 3.5–5.1)
Sodium: 137 mmol/L (ref 135–145)
Total Bilirubin: 0.8 mg/dL (ref 0.3–1.2)
Total Protein: 7.5 g/dL (ref 6.5–8.1)

## 2016-11-20 LAB — TYPE AND SCREEN
ABO/RH(D): A POS
Antibody Screen: NEGATIVE

## 2016-11-20 LAB — HCG, QUANTITATIVE, PREGNANCY: hCG, Beta Chain, Quant, S: <1 m[IU]/mL (ref <5)

## 2016-11-22 ENCOUNTER — Ambulatory Visit (HOSPITAL_COMMUNITY): Payer: Medicaid Other | Admitting: Anesthesiology

## 2016-11-22 ENCOUNTER — Inpatient Hospital Stay (HOSPITAL_COMMUNITY)
Admission: RE | Admit: 2016-11-22 | Discharge: 2016-11-22 | DRG: 743 | Disposition: A | Discharge status: Home / Self Care | Payer: Medicaid Other | Source: Ambulatory Visit | Attending: Obstetrics & Gynecology | Admitting: Obstetrics & Gynecology

## 2016-11-22 ENCOUNTER — Encounter (HOSPITAL_COMMUNITY)
Admission: RE | Disposition: A | Discharge status: Home / Self Care | Payer: Self-pay | Source: Ambulatory Visit | Attending: Obstetrics & Gynecology

## 2016-11-22 ENCOUNTER — Encounter (HOSPITAL_COMMUNITY): Payer: Self-pay

## 2016-11-22 DIAGNOSIS — J45909 Unspecified asthma, uncomplicated: Secondary | ICD-10-CM | POA: Diagnosis present

## 2016-11-22 DIAGNOSIS — N83202 Unspecified ovarian cyst, left side: Secondary | ICD-10-CM | POA: Diagnosis not present

## 2016-11-22 DIAGNOSIS — E785 Hyperlipidemia, unspecified: Secondary | ICD-10-CM | POA: Diagnosis present

## 2016-11-22 DIAGNOSIS — Z90710 Acquired absence of both cervix and uterus: Secondary | ICD-10-CM | POA: Diagnosis present

## 2016-11-22 DIAGNOSIS — Z825 Family history of asthma and other chronic lower respiratory diseases: Secondary | ICD-10-CM

## 2016-11-22 DIAGNOSIS — F419 Anxiety disorder, unspecified: Secondary | ICD-10-CM | POA: Diagnosis not present

## 2016-11-22 DIAGNOSIS — Z8349 Family history of other endocrine, nutritional and metabolic diseases: Secondary | ICD-10-CM

## 2016-11-22 DIAGNOSIS — Z9049 Acquired absence of other specified parts of digestive tract: Secondary | ICD-10-CM

## 2016-11-22 DIAGNOSIS — N83311 Acquired atrophy of right ovary: Secondary | ICD-10-CM | POA: Diagnosis present

## 2016-11-22 DIAGNOSIS — F17210 Nicotine dependence, cigarettes, uncomplicated: Secondary | ICD-10-CM | POA: Diagnosis present

## 2016-11-22 DIAGNOSIS — Z8249 Family history of ischemic heart disease and other diseases of the circulatory system: Secondary | ICD-10-CM

## 2016-11-22 DIAGNOSIS — N946 Dysmenorrhea, unspecified: Secondary | ICD-10-CM | POA: Diagnosis present

## 2016-11-22 DIAGNOSIS — Z808 Family history of malignant neoplasm of other organs or systems: Secondary | ICD-10-CM | POA: Diagnosis not present

## 2016-11-22 DIAGNOSIS — N921 Excessive and frequent menstruation with irregular cycle: Principal | ICD-10-CM | POA: Diagnosis present

## 2016-11-22 DIAGNOSIS — Z823 Family history of stroke: Secondary | ICD-10-CM | POA: Diagnosis not present

## 2016-11-22 DIAGNOSIS — R1011 Right upper quadrant pain: Secondary | ICD-10-CM

## 2016-11-22 DIAGNOSIS — G8929 Other chronic pain: Secondary | ICD-10-CM | POA: Diagnosis present

## 2016-11-22 DIAGNOSIS — N920 Excessive and frequent menstruation with regular cycle: Secondary | ICD-10-CM | POA: Diagnosis present

## 2016-11-22 DIAGNOSIS — Z803 Family history of malignant neoplasm of breast: Secondary | ICD-10-CM | POA: Diagnosis not present

## 2016-11-22 DIAGNOSIS — Z833 Family history of diabetes mellitus: Secondary | ICD-10-CM

## 2016-11-22 HISTORY — PX: SALPINGOOPHORECTOMY: SHX82

## 2016-11-22 HISTORY — PX: VAGINAL HYSTERECTOMY: SHX2639

## 2016-11-22 LAB — BASIC METABOLIC PANEL
Anion gap: 7 (ref 5–15)
BUN: 14 mg/dL (ref 6–20)
CO2: 26 mmol/L (ref 22–32)
Calcium: 9 mg/dL (ref 8.9–10.3)
Chloride: 104 mmol/L (ref 101–111)
Creatinine, Ser: 0.72 mg/dL (ref 0.44–1.00)
GFR calc Af Amer: >60 mL/min (ref >60)
GFR calc non Af Amer: >60 mL/min (ref >60)
Glucose, Bld: 112 mg/dL — ABNORMAL HIGH (ref 65–99)
Potassium: 3.4 mmol/L — ABNORMAL LOW (ref 3.5–5.1)
Sodium: 137 mmol/L (ref 135–145)

## 2016-11-22 LAB — CBC
HCT: 36.2 % (ref 36.0–46.0)
Hemoglobin: 12 g/dL (ref 12.0–15.0)
MCH: 32.1 pg (ref 26.0–34.0)
MCHC: 33.1 g/dL (ref 30.0–36.0)
MCV: 96.8 fL (ref 78.0–100.0)
Platelets: 319 10*3/uL (ref 150–400)
RBC: 3.74 MIL/uL — ABNORMAL LOW (ref 3.87–5.11)
RDW: 13.4 % (ref 11.5–15.5)
WBC: 12.4 10*3/uL — ABNORMAL HIGH (ref 4.0–10.5)

## 2016-11-22 SURGERY — HYSTERECTOMY, VAGINAL
Anesthesia: General

## 2016-11-22 MED ORDER — ALBUTEROL SULFATE HFA 108 (90 BASE) MCG/ACT IN AERS: 1.0000 | INHALATION_SPRAY | Freq: Four times a day (QID) | RESPIRATORY_TRACT | Status: DC | PRN

## 2016-11-22 MED ORDER — LIDOCAINE HCL (PF) 1 % IJ SOLN
INTRAMUSCULAR | Status: AC
  Filled 2016-11-22: qty 5

## 2016-11-22 MED ORDER — GLYCOPYRROLATE 0.2 MG/ML IJ SOLN
INTRAMUSCULAR | Status: AC
  Filled 2016-11-22: qty 2

## 2016-11-22 MED ORDER — FENTANYL CITRATE (PF) 100 MCG/2ML IJ SOLN
INTRAMUSCULAR | Status: AC
  Filled 2016-11-22: qty 2

## 2016-11-22 MED ORDER — SENNOSIDES-DOCUSATE SODIUM 8.6-50 MG PO TABS: 1.0000 | ORAL_TABLET | Freq: Every evening | ORAL | Status: DC | PRN

## 2016-11-22 MED ORDER — KCL IN DEXTROSE-NACL 20-5-0.45 MEQ/L-%-% IV SOLN
INTRAVENOUS | Status: DC
  Administered 2016-11-22: 13:00:00 via INTRAVENOUS

## 2016-11-22 MED ORDER — MONTELUKAST SODIUM 10 MG PO TABS: 10.0000 mg | ORAL_TABLET | Freq: Every day | ORAL | Status: DC

## 2016-11-22 MED ORDER — CEFAZOLIN SODIUM-DEXTROSE 2-4 GM/100ML-% IV SOLN
2.0000 g | INTRAVENOUS | Status: AC
  Administered 2016-11-22: 2 g via INTRAVENOUS
  Filled 2016-11-22: qty 100

## 2016-11-22 MED ORDER — LORATADINE 10 MG PO TABS
10.0000 mg | ORAL_TABLET | Freq: Every day | ORAL | Status: DC
  Administered 2016-11-22: 10 mg via ORAL
  Filled 2016-11-22: qty 1

## 2016-11-22 MED ORDER — NEOSTIGMINE METHYLSULFATE 10 MG/10ML IV SOLN
INTRAVENOUS | Status: AC
  Filled 2016-11-22: qty 1

## 2016-11-22 MED ORDER — SODIUM CHLORIDE 0.9 % IR SOLN
Status: DC | PRN
  Administered 2016-11-22: 1000 mL
  Administered 2016-11-22: 3000 mL

## 2016-11-22 MED ORDER — KETOROLAC TROMETHAMINE 10 MG PO TABS: 10.0000 mg | ORAL_TABLET | Freq: Three times a day (TID) | ORAL | 0 refills | Status: DC | PRN

## 2016-11-22 MED ORDER — ROCURONIUM BROMIDE 50 MG/5ML IV SOLN
INTRAVENOUS | Status: AC
  Filled 2016-11-22: qty 2

## 2016-11-22 MED ORDER — SODIUM CHLORIDE 0.9 % IJ SOLN
INTRAMUSCULAR | Status: AC
  Filled 2016-11-22: qty 10

## 2016-11-22 MED ORDER — PROPOFOL 10 MG/ML IV BOLUS
INTRAVENOUS | Status: AC
  Filled 2016-11-22: qty 20

## 2016-11-22 MED ORDER — CEFAZOLIN SODIUM-DEXTROSE 2-4 GM/100ML-% IV SOLN
INTRAVENOUS | Status: AC
  Filled 2016-11-22: qty 100

## 2016-11-22 MED ORDER — ONDANSETRON HCL 4 MG/2ML IJ SOLN
INTRAMUSCULAR | Status: AC
  Filled 2016-11-22: qty 2

## 2016-11-22 MED ORDER — PROPOFOL 10 MG/ML IV BOLUS
INTRAVENOUS | Status: DC | PRN
  Administered 2016-11-22: 200 mg via INTRAVENOUS

## 2016-11-22 MED ORDER — HYDROXYZINE HCL 25 MG PO TABS
50.0000 mg | ORAL_TABLET | Freq: Three times a day (TID) | ORAL | Status: DC | PRN
  Administered 2016-11-22: 50 mg via ORAL
  Filled 2016-11-22: qty 2

## 2016-11-22 MED ORDER — SUFENTANIL CITRATE 50 MCG/ML IV SOLN
INTRAVENOUS | Status: AC
  Filled 2016-11-22: qty 1

## 2016-11-22 MED ORDER — ALBUTEROL SULFATE (2.5 MG/3ML) 0.083% IN NEBU
2.5000 mg | INHALATION_SOLUTION | Freq: Four times a day (QID) | RESPIRATORY_TRACT | Status: DC | PRN
Start: 2016-11-22 — End: 2016-11-23

## 2016-11-22 MED ORDER — HYDROXYZINE PAMOATE 50 MG PO CAPS: 50.0000 mg | ORAL_CAPSULE | Freq: Three times a day (TID) | ORAL | Status: DC | PRN

## 2016-11-22 MED ORDER — ONDANSETRON HCL 8 MG PO TABS: 8.0000 mg | ORAL_TABLET | Freq: Four times a day (QID) | ORAL | 0 refills | Status: DC | PRN

## 2016-11-22 MED ORDER — ALUM & MAG HYDROXIDE-SIMETH 200-200-20 MG/5ML PO SUSP: 30.0000 mL | ORAL | Status: DC | PRN

## 2016-11-22 MED ORDER — THEOPHYLLINE ER 300 MG PO CP24: 300.0000 mg | ORAL_CAPSULE | Freq: Three times a day (TID) | ORAL | Status: DC

## 2016-11-22 MED ORDER — PNEUMOCOCCAL VAC POLYVALENT 25 MCG/0.5ML IJ INJ: 0.5000 mL | INJECTION | INTRAMUSCULAR | Status: DC

## 2016-11-22 MED ORDER — IPRATROPIUM-ALBUTEROL 0.5-2.5 (3) MG/3ML IN SOLN: 3.0000 mL | Freq: Four times a day (QID) | RESPIRATORY_TRACT | Status: DC

## 2016-11-22 MED ORDER — LIDOCAINE HCL (CARDIAC) 10 MG/ML IV SOLN
INTRAVENOUS | Status: DC | PRN
  Administered 2016-11-22: 50 mg via INTRAVENOUS

## 2016-11-22 MED ORDER — GLYCOPYRROLATE 0.2 MG/ML IJ SOLN
INTRAMUSCULAR | Status: DC | PRN
  Administered 2016-11-22: 0.6 mg via INTRAVENOUS

## 2016-11-22 MED ORDER — GLYCOPYRROLATE 0.2 MG/ML IJ SOLN
INTRAMUSCULAR | Status: AC
  Filled 2016-11-22: qty 1

## 2016-11-22 MED ORDER — MIDAZOLAM HCL 2 MG/2ML IJ SOLN
1.0000 mg | INTRAMUSCULAR | Status: AC
  Administered 2016-11-22 (×2): 2 mg via INTRAVENOUS
  Filled 2016-11-22: qty 2

## 2016-11-22 MED ORDER — SODIUM CHLORIDE 0.9 % IV SOLN
8.0000 mg | Freq: Four times a day (QID) | INTRAVENOUS | Status: DC | PRN
  Filled 2016-11-22: qty 4

## 2016-11-22 MED ORDER — KETOROLAC TROMETHAMINE 30 MG/ML IJ SOLN
30.0000 mg | Freq: Once | INTRAMUSCULAR | Status: AC
  Administered 2016-11-22: 30 mg via INTRAVENOUS
  Filled 2016-11-22: qty 1

## 2016-11-22 MED ORDER — ROCURONIUM BROMIDE 50 MG/5ML IV SOLN
INTRAVENOUS | Status: AC
  Filled 2016-11-22: qty 1

## 2016-11-22 MED ORDER — OXYCODONE-ACETAMINOPHEN 5-325 MG PO TABS
1.0000 | ORAL_TABLET | ORAL | Status: DC | PRN
Start: 2016-11-22 — End: 2016-11-23
  Administered 2016-11-22: 2 via ORAL
  Filled 2016-11-22: qty 2

## 2016-11-22 MED ORDER — CIPROFLOXACIN HCL 500 MG PO TABS
500.0000 mg | ORAL_TABLET | Freq: Two times a day (BID) | ORAL | 0 refills | Status: DC
Start: 2016-11-22 — End: 2016-12-22

## 2016-11-22 MED ORDER — BUPIVACAINE-EPINEPHRINE (PF) 0.5% -1:200000 IJ SOLN
INTRAMUSCULAR | Status: AC
  Filled 2016-11-22: qty 30

## 2016-11-22 MED ORDER — SUCCINYLCHOLINE CHLORIDE 20 MG/ML IJ SOLN
INTRAMUSCULAR | Status: AC
  Filled 2016-11-22: qty 1

## 2016-11-22 MED ORDER — GLYCOPYRROLATE 0.2 MG/ML IJ SOLN
INTRAMUSCULAR | Status: AC
Start: 2016-11-22 — End: 2016-11-22
  Filled 2016-11-22: qty 1

## 2016-11-22 MED ORDER — IPRATROPIUM-ALBUTEROL 0.5-2.5 (3) MG/3ML IN SOLN
RESPIRATORY_TRACT | Status: AC
  Filled 2016-11-22: qty 3

## 2016-11-22 MED ORDER — HYDROMORPHONE HCL 1 MG/ML IJ SOLN
1.0000 mg | INTRAMUSCULAR | Status: DC | PRN
Start: 2016-11-22 — End: 2016-11-23
  Administered 2016-11-22: 1 mg via INTRAVENOUS
  Filled 2016-11-22: qty 1

## 2016-11-22 MED ORDER — LACTATED RINGERS IV SOLN
INTRAVENOUS | Status: DC
  Administered 2016-11-22 (×2): via INTRAVENOUS

## 2016-11-22 MED ORDER — BUPIVACAINE-EPINEPHRINE (PF) 0.5% -1:200000 IJ SOLN
INTRAMUSCULAR | Status: DC | PRN
  Administered 2016-11-22: 17 mL via PERINEURAL

## 2016-11-22 MED ORDER — SUFENTANIL CITRATE 50 MCG/ML IV SOLN
INTRAVENOUS | Status: DC | PRN
  Administered 2016-11-22 (×2): 10 ug via INTRAVENOUS
  Administered 2016-11-22: 5 ug via INTRAVENOUS
  Administered 2016-11-22: 10 ug via INTRAVENOUS
  Administered 2016-11-22: 5 ug via INTRAVENOUS
  Administered 2016-11-22: 10 ug via INTRAVENOUS
  Administered 2016-11-22: 5 ug via INTRAVENOUS
  Administered 2016-11-22: 10 ug via INTRAVENOUS

## 2016-11-22 MED ORDER — NEOSTIGMINE METHYLSULFATE 10 MG/10ML IV SOLN
INTRAVENOUS | Status: DC | PRN
  Administered 2016-11-22: 3 mg via INTRAVENOUS

## 2016-11-22 MED ORDER — SUCCINYLCHOLINE CHLORIDE 20 MG/ML IJ SOLN
INTRAMUSCULAR | Status: DC | PRN
  Administered 2016-11-22: 100 mg via INTRAVENOUS

## 2016-11-22 MED ORDER — ALPRAZOLAM 1 MG PO TABS
1.0000 mg | ORAL_TABLET | Freq: Three times a day (TID) | ORAL | Status: DC
  Administered 2016-11-22: 1 mg via ORAL
  Filled 2016-11-22: qty 1

## 2016-11-22 MED ORDER — SIMVASTATIN 20 MG PO TABS
40.0000 mg | ORAL_TABLET | Freq: Every evening | ORAL | Status: DC
  Filled 2016-11-22 (×2): qty 1

## 2016-11-22 MED ORDER — OXYCODONE-ACETAMINOPHEN 5-325 MG PO TABS: 1.0000 | ORAL_TABLET | ORAL | 0 refills | Status: DC | PRN

## 2016-11-22 MED ORDER — ONDANSETRON HCL 4 MG PO TABS
8.0000 mg | ORAL_TABLET | Freq: Four times a day (QID) | ORAL | Status: DC | PRN
  Filled 2016-11-22: qty 1

## 2016-11-22 MED ORDER — ROCURONIUM BROMIDE 100 MG/10ML IV SOLN
INTRAVENOUS | Status: DC | PRN
  Administered 2016-11-22: 30 mg via INTRAVENOUS
  Administered 2016-11-22: 20 mg via INTRAVENOUS
  Administered 2016-11-22: 10 mg via INTRAVENOUS

## 2016-11-22 MED ORDER — MIDAZOLAM HCL 2 MG/2ML IJ SOLN
INTRAMUSCULAR | Status: AC
  Filled 2016-11-22: qty 2

## 2016-11-22 MED ORDER — HYDROMORPHONE HCL 1 MG/ML IJ SOLN
0.2500 mg | INTRAMUSCULAR | Status: DC | PRN
  Administered 2016-11-22 (×3): 0.5 mg via INTRAVENOUS
  Filled 2016-11-22 (×2): qty 1

## 2016-11-22 MED ORDER — SIMETHICONE 80 MG PO CHEW: 80.0000 mg | CHEWABLE_TABLET | Freq: Four times a day (QID) | ORAL | Status: DC | PRN

## 2016-11-22 MED ORDER — LORATADINE 10 MG PO TABS: 10.0000 mg | ORAL_TABLET | Freq: Every day | ORAL | Status: DC

## 2016-11-22 MED ORDER — IPRATROPIUM-ALBUTEROL 0.5-2.5 (3) MG/3ML IN SOLN
3.0000 mL | Freq: Once | RESPIRATORY_TRACT | Status: AC
  Administered 2016-11-22: 3 mL via RESPIRATORY_TRACT

## 2016-11-22 SURGICAL SUPPLY — 61 items
ADH SKN CLS APL DERMABOND .7 (GAUZE/BANDAGES/DRESSINGS) ×2
APPLIER CLIP 13 LRG OPEN (CLIP)
APR CLP LRG 13 20 CLIP (CLIP)
BAG HAMPER (MISCELLANEOUS) ×3 IMPLANT
BLADE SURG SZ10 CARB STEEL (BLADE) IMPLANT
CELLS DAT CNTRL 66122 CELL SVR (MISCELLANEOUS) IMPLANT
CLIP APPLIE 13 LRG OPEN (CLIP) IMPLANT
CLOTH BEACON ORANGE TIMEOUT ST (SAFETY) ×3 IMPLANT
COVER LIGHT HANDLE STERIS (MISCELLANEOUS) ×6 IMPLANT
DECANTER SPIKE VIAL GLASS SM (MISCELLANEOUS) ×3 IMPLANT
DERMABOND ADVANCED (GAUZE/BANDAGES/DRESSINGS) ×1
DERMABOND ADVANCED .7 DNX12 (GAUZE/BANDAGES/DRESSINGS) ×2 IMPLANT
DRAPE HALF SHEET 40X57 (DRAPES) ×1 IMPLANT
DRAPE PROXIMA HALF (DRAPES) ×3 IMPLANT
DRAPE STERI URO 9X17 APER PCH (DRAPES) ×3 IMPLANT
DRAPE WARM FLUID 44X44 (DRAPE) ×3 IMPLANT
ELECT REM PT RETURN 9FT ADLT (ELECTROSURGICAL) ×3
ELECTRODE REM PT RTRN 9FT ADLT (ELECTROSURGICAL) ×2 IMPLANT
FORMALIN 10 PREFIL 480ML (MISCELLANEOUS) ×3 IMPLANT
GAUZE PACKING 2X5 YD STRL (GAUZE/BANDAGES/DRESSINGS) IMPLANT
GLOVE BIOGEL PI IND STRL 6.5 (GLOVE) IMPLANT
GLOVE BIOGEL PI IND STRL 7.0 (GLOVE) ×2 IMPLANT
GLOVE BIOGEL PI IND STRL 8 (GLOVE) ×2 IMPLANT
GLOVE BIOGEL PI INDICATOR 6.5 (GLOVE) ×1
GLOVE BIOGEL PI INDICATOR 7.0 (GLOVE) ×1
GLOVE BIOGEL PI INDICATOR 8 (GLOVE) ×1
GLOVE ECLIPSE 7.0 STRL STRAW (GLOVE) ×1 IMPLANT
GLOVE ECLIPSE 8.0 STRL XLNG CF (GLOVE) ×3 IMPLANT
GLOVE INDICATOR 7.0 STRL GRN (GLOVE) ×3 IMPLANT
GOWN STRL REUS W/TWL LRG LVL3 (GOWN DISPOSABLE) ×6 IMPLANT
GOWN STRL REUS W/TWL XL LVL3 (GOWN DISPOSABLE) ×3 IMPLANT
INST SET MAJOR GENERAL (KITS) ×3 IMPLANT
IV NS IRRIG 3000ML ARTHROMATIC (IV SOLUTION) ×3 IMPLANT
KIT ROOM TURNOVER AP CYSTO (KITS) ×3 IMPLANT
KIT ROOM TURNOVER APOR (KITS) ×3 IMPLANT
MANIFOLD NEPTUNE II (INSTRUMENTS) ×3 IMPLANT
NDL HYPO 25X1 1.5 SAFETY (NEEDLE) ×2 IMPLANT
NEEDLE HYPO 25X1 1.5 SAFETY (NEEDLE) ×3 IMPLANT
NS IRRIG 1000ML POUR BTL (IV SOLUTION) ×6 IMPLANT
PACK ABDOMINAL MAJOR (CUSTOM PROCEDURE TRAY) ×3 IMPLANT
PACK PERI GYN (CUSTOM PROCEDURE TRAY) ×3 IMPLANT
PAD ARMBOARD 7.5X6 YLW CONV (MISCELLANEOUS) ×3 IMPLANT
RETRACTOR WND ALEXIS 18 MED (MISCELLANEOUS) IMPLANT
RETRACTOR WND ALEXIS 25 LRG (MISCELLANEOUS) IMPLANT
RTRCTR WOUND ALEXIS 18CM MED (MISCELLANEOUS)
RTRCTR WOUND ALEXIS 25CM LRG (MISCELLANEOUS)
SET BASIN LINEN APH (SET/KITS/TRAYS/PACK) ×3 IMPLANT
STAPLER VISISTAT 35W (STAPLE) ×3 IMPLANT
SUT CHROMIC 0 CT 1 (SUTURE) ×3 IMPLANT
SUT MNCRL+ AB 3-0 CT1 36 (SUTURE) IMPLANT
SUT MON AB 3-0 SH 27 (SUTURE) IMPLANT
SUT MONOCRYL AB 3-0 CT1 36IN (SUTURE) ×1
SUT VIC AB 0 CT1 27 (SUTURE) ×12
SUT VIC AB 0 CT1 27XBRD ANTBC (SUTURE) ×2 IMPLANT
SUT VIC AB 0 CT1 27XCR 8 STRN (SUTURE) ×4 IMPLANT
SUT VIC AB 0 CTX 36 (SUTURE) ×3
SUT VIC AB 0 CTX36XBRD ANTBCTR (SUTURE) ×2 IMPLANT
SUT VICRYL 3 0 (SUTURE) ×3 IMPLANT
SYR CONTROL 10ML LL (SYRINGE) ×3 IMPLANT
TRAY FOLEY W/METER SILVER 16FR (SET/KITS/TRAYS/PACK) ×3 IMPLANT
VERSALIGHT (MISCELLANEOUS) ×3 IMPLANT

## 2016-11-22 NOTE — Interval H&P Note (Signed)
History and Physical Interval Note:  11/22/2016 8:24 AM  Chloe Joyce  has presented today for surgery, with the diagnosis of Menometorrhagia dysmenorrhea  The various methods of treatment have been discussed with the patient and family. After consideration of risks, benefits and other options for treatment, the patient has consented to  Procedure(s): HYSTERECTOMY VAGINAL (N/A) SALPINGO OOPHORECTOMY (Bilateral) as a surgical intervention .  The patient's history has been reviewed, patient examined, no change in status, stable for surgery.  I have reviewed the patient's chart and labs.  Questions were answered to the patient's satisfaction.     Kyl Givler H

## 2016-11-22 NOTE — Anesthesia Postprocedure Evaluation (Signed)
Anesthesia Post Note  Patient: Chloe Joyce  Procedure(s) Performed: Procedure(s) (LRB): HYSTERECTOMY VAGINAL (N/A) BILATERAL SALPINGO OOPHORECTOMY (Bilateral)  Anesthesia Type: General Level of consciousness: awake Pain management: satisfactory to patient Vital Signs Assessment: post-procedure vital signs reviewed and stable Respiratory status: spontaneous breathing Cardiovascular status: stable Anesthetic complications: no     Last Vitals:  Vitals:   11/22/16 1100 11/22/16 1115  BP: 124/86 121/84  Pulse: 99 72  Resp: 18 19  Temp: 36.8 C     Last Pain:  Vitals:   11/22/16 1115  PainSc: 7                  Jossiah Smoak

## 2016-11-22 NOTE — Anesthesia Preprocedure Evaluation (Signed)
Anesthesia Evaluation  Patient identified by MRN, date of birth, ID band Patient awake    Reviewed: Allergy & Precautions, NPO status , Patient's Chart, lab work & pertinent test results  Airway Mallampati: II  TM Distance: >3 FB     Dental  (+) Teeth Intact   Pulmonary asthma , Current Smoker,    breath sounds clear to auscultation       Cardiovascular negative cardio ROS   Rhythm:Regular Rate:Normal     Neuro/Psych  Headaches, PSYCHIATRIC DISORDERS Anxiety    GI/Hepatic negative GI ROS,   Endo/Other    Renal/GU      Musculoskeletal   Abdominal   Peds  Hematology   Anesthesia Other Findings   Reproductive/Obstetrics                             Anesthesia Physical Anesthesia Plan  ASA: III  Anesthesia Plan: General   Post-op Pain Management:    Induction: Intravenous  Airway Management Planned: Oral ETT  Additional Equipment:   Intra-op Plan:   Post-operative Plan: Extubation in OR  Informed Consent: I have reviewed the patients History and Physical, chart, labs and discussed the procedure including the risks, benefits and alternatives for the proposed anesthesia with the patient or authorized representative who has indicated his/her understanding and acceptance.     Plan Discussed with:   Anesthesia Plan Comments:         Anesthesia Quick Evaluation

## 2016-11-22 NOTE — Op Note (Signed)
Preoperative diagnosis:  1.  menometrorrhagia                                         2.  dysmenorrhea                                         3.  RLQ pain                                         4.  Left ovarian cyst  Postoperative diagnosis:  Same as above   Procedure:  Vaginal hysterectomy with removal of both tubes and ovaries  Surgeon:  Florian Buff MD  Anesthesia:  General Endotracheal  Findings:  Pt has chronic long standing heavy periods which have worsened over the past few months. Her dysmenorrhea has also worsened along with the volume of bleeding. Sonogram revelas cyst of the left ovary. 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 We discussed various options and have had her on megestrol now for 2+ months with minimal success at present which makes it less likely she will respond to an ablation. Initially she wanted to take a "wait and see" approach but has now decided to proceed with definitive surgical management since her bleeding and pain have continued.  Intraoperative findings were Uterus was a bit generous, boggy consistent with adenomyosis Left ovary with simple cyst Right ovary appeared atrophic  Description of operation:  The patient was taken from the preoperative area to the operating room in stable condition. She was placed in the sitting position and underwent a spinal anesthetic. Once an adequate level of anesthesia was attained she was placed in the dorsal lithotomy position. Patient was prepped and draped in the usual sterile fashion and a Foley catheter was placed.  A weighted speculum was placed and the cervix was grasped with thyroid tenaculums both anteriorly and posteriorly.  0.5% Marcaine plain was injected in a circumferential fashion about the cervix and the electrocautery unit was used to incise the vagina and push at all cervix.  The posterior cul-de-sac was then entered sharply without difficulty.  The uterosacral ligaments were clamped  cut and inspection suture ligated and held.  The cardinal ligaments were then clamped cut transfixion suture ligated and cut. The anterior peritoneum was identified the anterior cul-de-sac was entered sharply without difficulty. The anterior and posterior leaves of the broad ligament were plicated and the uterine vessels were clamped cut and suture ligated. Serial pedicles were taken of the fundus with each pedicle being clamped cut and suture ligated. The utero-ovarian ligaments were crossclamped the uterus was removed and both pedicles were transfixion suture ligated. There was good hemostasis of all the pedicles. The infundibulo pelvic ligaments were cross clamped and both tubes and ovaries were remvoed.  Fore-and-aft sutures were placed bilaterally The anterior and posterior vagina was closed in interrupted fashion with good resultant hemostasis. The peritoneum was incorporated into this closure as well.  before closure the lower pelvis and vagina were irrigated vigorously.  The sponge needle and instrument counts were correct x 3.  Total blood loss for the procedure was 150 cc.  The patient received 2 g of Ancef and 30 mg of  Toradol IV preoperatively prophylactically.  She was taken to the recovery room in good stable condition awake alert doing well.  EURE,LUTHER H 11/22/2016, 10:43 AM

## 2016-11-22 NOTE — Anesthesia Procedure Notes (Signed)
Procedure Name: Intubation Date/Time: 11/22/2016 9:06 AM Performed by: Vista Deck Pre-anesthesia Checklist: Patient identified, Patient being monitored, Timeout performed, Emergency Drugs available and Suction available Patient Re-evaluated:Patient Re-evaluated prior to inductionOxygen Delivery Method: Circle System Utilized Preoxygenation: Pre-oxygenation with 100% oxygen Intubation Type: IV induction Ventilation: Mask ventilation without difficulty Laryngoscope Size: Miller and 2 Grade View: Grade I Tube type: Oral Tube size: 7.0 mm Number of attempts: 1 Airway Equipment and Method: Stylet and Oral airway Placement Confirmation: ETT inserted through vocal cords under direct vision,  positive ETCO2 and breath sounds checked- equal and bilateral Secured at: 21 cm Tube secured with: Tape Dental Injury: Teeth and Oropharynx as per pre-operative assessment

## 2016-11-22 NOTE — Discharge Summary (Signed)
Physician Discharge Summary  Patient ID: Chloe Joyce MRN: [id number] DOB/AGE: [birth year] 46 y.o.  Admit date: 11/22/2016 Discharge date: 11/22/2016  Admission Diagnoses: Menometrorrhagia dysmenorrhea left ovarian cyst rlq pain  Discharge Diagnoses:  Active Problems:   S/P vaginal hysterectomy removal of both tubes and ovaries  Discharged Condition: good  Hospital Course: unremarkable post op course  Results for orders placed or performed during the hospital encounter of 11/22/16 (from the past 24 hour(s))  CBC   Collection Time: 11/22/16  3:54 PM  Result Value Ref Range   WBC 12.4 (H) 4.0 - 10.5 K/uL   RBC 3.74 (L) 3.87 - 5.11 MIL/uL   Hemoglobin 12.0 12.0 - 15.0 g/dL   HCT 36.2 36.0 - 46.0 %   MCV 96.8 78.0 - 100.0 fL   MCH 32.1 26.0 - 34.0 pg   MCHC 33.1 30.0 - 36.0 g/dL   RDW 13.4 11.5 - 15.5 %   Platelets 319 150 - 400 K/uL  Basic metabolic panel   Collection Time: 11/22/16  3:54 PM  Result Value Ref Range   Sodium 137 135 - 145 mmol/L   Potassium 3.4 (L) 3.5 - 5.1 mmol/L   Chloride 104 101 - 111 mmol/L   CO2 26 22 - 32 mmol/L   Glucose, Bld 112 (H) 65 - 99 mg/dL   BUN 14 6 - 20 mg/dL   Creatinine, Ser 0.72 0.44 - 1.00 mg/dL   Calcium 9.0 8.9 - 10.3 mg/dL   GFR calc non Af Amer >60 >60 mL/min   GFR calc Af Amer >60 >60 mL/min   Anion gap 7 5 - 15     Consults: None  Significant Diagnostic Studies: labs:   Treatments: surgery: TVH BSO  Discharge Exam: Blood pressure 115/76, pulse 91, temperature 97.9 F (36.6 C), temperature source Oral, resp. rate 18, height 5\' 6"  (1.676 m), weight 181 lb (82.1 kg), SpO2 98 %. General appearance: alert, cooperative and no distress GI: soft, non-tender; bowel sounds normal; no masses,  no organomegaly  Disposition: 01-Home or Self Care  Discharge Instructions    Call MD for:  persistant nausea and vomiting    Complete by:  As directed    Call MD for:  severe uncontrolled pain    Complete by:  As directed    Call  MD for:  temperature >100.4    Complete by:  As directed    Diet - low sodium heart healthy    Complete by:  As directed    Driving Restrictions    Complete by:  As directed    Do not drive for 1 week   Increase activity slowly    Complete by:  As directed    Lifting restrictions    Complete by:  As directed    Do not lift more than 10 pounds   Sexual Activity Restrictions    Complete by:  As directed    You are kidding right?     Allergies as of 11/22/2016   No Known Allergies     Medication List    STOP taking these medications   traMADol 50 MG tablet Commonly known as:  ULTRAM     TAKE these medications   albuterol 108 (90 Base) MCG/ACT inhaler Commonly known as:  PROVENTIL HFA;VENTOLIN HFA Inhale 1-2 puffs into the lungs every 6 (six) hours as needed for wheezing or shortness of breath.   ALPRAZolam 1 MG tablet Commonly known as:  XANAX Take 1 mg by mouth 3 (three)  times daily.   cetirizine 10 MG tablet Commonly known as:  ZYRTEC Take 10 mg by mouth as needed for allergies.   ciprofloxacin 500 MG tablet Commonly known as:  CIPRO Take 1 tablet (500 mg total) by mouth 2 (two) times daily.   CLARITIN 10 MG tablet Generic drug:  loratadine Take 10 mg by mouth daily.   hydrOXYzine 50 MG capsule Commonly known as:  VISTARIL Take 50 mg by mouth 3 (three) times daily as needed for anxiety (sleep).   ketorolac 10 MG tablet Commonly known as:  TORADOL Take 1 tablet (10 mg total) by mouth every 8 (eight) hours as needed.   montelukast 10 MG tablet Commonly known as:  SINGULAIR Take 10 mg by mouth at bedtime.   ondansetron 8 MG tablet Commonly known as:  ZOFRAN Take 1 tablet (8 mg total) by mouth every 6 (six) hours as needed for nausea.   oxyCODONE-acetaminophen 5-325 MG tablet Commonly known as:  PERCOCET/ROXICET Take 1-2 tablets by mouth every 4 (four) hours as needed (moderate to severe pain (when tolerating fluids)).   simvastatin 40 MG tablet Commonly  known as:  ZOCOR Take 40 mg by mouth every evening.   theophylline 300 MG 24 hr capsule Commonly known as:  THEO-24 Take 1 capsule (300 mg total) by mouth 3 (three) times daily.      Follow-up Information    Florian Buff, MD Follow up in 1 week(s).   Specialties:  Obstetrics and Gynecology, Radiology Why:  post op appt Contact information: [place] [id number] [phone]           Signed: Florian Buff 11/22/2016, 6:48 PM

## 2016-11-22 NOTE — Transfer of Care (Signed)
Immediate Anesthesia Transfer of Care Note  Patient: Chloe Joyce  Procedure(s) Performed: Procedure(s): HYSTERECTOMY VAGINAL (N/A) BILATERAL SALPINGO OOPHORECTOMY (Bilateral)  Patient Location: PACU  Anesthesia Type:General  Level of Consciousness: awake and patient cooperative  Airway & Oxygen Therapy: Patient Spontanous Breathing and non-rebreather face mask  Post-op Assessment: Report given to RN and Post -op Vital signs reviewed and stable  Post vital signs: Reviewed and stable  Last Vitals:  Vitals:   11/22/16 0850 11/22/16 1100  BP:    Pulse:  99  Resp: 16 18  Temp:  36.8 C    Last Pain: There were no vitals filed for this visit.       Complications: No apparent anesthesia complications

## 2016-11-22 NOTE — Discharge Instructions (Signed)
Vaginal Hysterectomy, Care After °Refer to this sheet in the next few weeks. These instructions provide you with information about caring for yourself after your procedure. Your health care provider may also give you more specific instructions. Your treatment has been planned according to current medical practices, but problems sometimes occur. Call your health care provider if you have any problems or questions after your procedure. °What can I expect after the procedure? °After the procedure, it is common to have: °· Pain. °· Soreness and numbness in your incision areas. °· Vaginal bleeding and discharge. °· Constipation. °· Temporary problems emptying the bladder. °· Feelings of sadness or other emotions. °Follow these instructions at home: °Medicines  °· Take over-the-counter and prescription medicines only as told by your health care provider. °· If you were prescribed an antibiotic medicine, take it as told by your health care provider. Do not stop taking the antibiotic even if you start to feel better. °· Do not drive or operate heavy machinery while taking prescription pain medicine. °Activity  °· Return to your normal activities as told by your health care provider. Ask your health care provider what activities are safe for you. °· Get regular exercise as told by your health care provider. You may be told to take short walks every day and go farther each time. °· Do not lift anything that is heavier than 10 lb (4.5 kg). °General instructions  ° °· Do not put anything in your vagina for 6 weeks after your surgery or as told by your health care provider. This includes tampons and douches. °· Do not have sex until your health care provider says you can. °· Do not take baths, swim, or use a hot tub until your health care provider approves. °· Drink enough fluid to keep your urine clear or pale yellow. °· Do not drive for 24 hours if you were given a sedative. °· Keep all follow-up visits as told by your health  care provider. This is important. °Contact a health care provider if: °· Your pain medicine is not helping. °· You have a fever. °· You have redness, swelling, or pain at your incision site. °· You have blood, pus, or a bad-smelling discharge from your vagina. °· You continue to have difficulty urinating. °Get help right away if: °· You have severe abdominal or back pain. °· You have heavy bleeding from your vagina. °· You have chest pain or shortness of breath. °This information is not intended to replace advice given to you by your health care provider. Make sure you discuss any questions you have with your health care provider. °Document Released: 11/29/2015 Document Revised: 01/13/2016 Document Reviewed: 08/22/2015 °[employer] Interactive Patient Education © 2017 [employer] ° °

## 2016-11-22 NOTE — Progress Notes (Signed)
PAtient discharged home, IV removed and site intact. Patient discharged with prescriptions and personal belongings.

## 2016-11-22 NOTE — H&P (Signed)
Preoperative History and Physical  Chloe Joyce is a 46 y.o. No obstetric history on file. with No LMP recorded. admitted for a TVH BS) 11/22/2016.  Pt has chronic long standing heavy periods which have worsened over the past few months. Her dysmenorrhea has also worsened along with the volume of bleeding. Sonogram revelas cyst of the left ovary. 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  We discussed various options and have had her on megestrol now for 2+ months with minimal success at present which makes it less likely she will respond to an ablation.  Initially she wanted to take a "wait and see" approach but has now decided to proceed with definitive surgical management since her bleeding and pain have continued.  PMH:        Past Medical History:  Diagnosis Date  . Anxiety   . Asthma   . Back pain   . Hyperlipidemia     PSH:          Past Surgical History:  Procedure Laterality Date  . CESAREAN SECTION    . CHOLECYSTECTOMY    . HEMORROIDECTOMY    . SHOULDER SURGERY Right   . TONSILLECTOMY AND ADENOIDECTOMY  1988  . TONSILLECTOMY AND ADENOIDECTOMY    . TUBAL LIGATION      POb/GynH:         OB History    No data available      SH:         Social History  Substance Use Topics  . Smoking status: Current Every Day Smoker    Packs/day: 1.00    Years: 20.00    Types: Cigarettes  . Smokeless tobacco: Never Used  . Alcohol use Yes     FH:          Family History  Problem Relation Age of Onset  . Hypertension Mother   . Cancer Mother     breast  . Stroke Mother   . Hyperlipidemia Mother   . Varicose Veins Mother   . Miscarriages / [place] Mother   . Brain cancer Mother   . Asthma Brother   . Hypothyroidism Son   . Diabetes Maternal Grandmother   . Heart disease Other      Allergies: No Known Allergies  Medications:       Current Outpatient Prescriptions:  .  albuterol (PROVENTIL  HFA;VENTOLIN HFA) 108 (90 BASE) MCG/ACT inhaler, Inhale 1-2 puffs into the lungs every 6 (six) hours as needed for wheezing or shortness of breath., Disp: 1 Inhaler, Rfl: 0 .  ALPRAZolam (XANAX) 1 MG tablet, Take 1 mg by mouth 3 (three) times daily., Disp: , Rfl:  .  hydrOXYzine (VISTARIL) 50 MG capsule, Take 50 mg by mouth 3 (three) times daily as needed., Disp: , Rfl:  .  loratadine (CLARITIN) 10 MG tablet, Take 10 mg by mouth daily., Disp: , Rfl:  .  megestrol (MEGACE) 40 MG tablet, 3 tablets a day for 5 days, 2 tablets a day for 5 days then 1 tablet daily, Disp: 45 tablet, Rfl: 3 .  montelukast (SINGULAIR) 10 MG tablet, Take 10 mg by mouth at bedtime., Disp: , Rfl:  .  simvastatin (ZOCOR) 40 MG tablet, Take 40 mg by mouth every evening. , Disp: , Rfl:  .  theophylline (THEO-24) 300 MG 24 hr capsule, Take 1 capsule (300 mg total) by mouth 3 (three) times daily., Disp: 60 capsule, Rfl: 1 .  traMADol (ULTRAM) 50 MG tablet, Take 50 mg by  mouth every 6 (six) hours as needed for moderate pain or severe pain. , Disp: , Rfl:  .  cetirizine (ZYRTEC) 10 MG tablet, Take 10 mg by mouth as needed for allergies., Disp: , Rfl:  .  HYDROcodone-acetaminophen (NORCO/VICODIN) 5-325 MG tablet, Take 1-2 tablets by mouth every 4 (four) hours as needed. (Patient not taking: Reported on 11/09/2016), Disp: 6 tablet, Rfl: 0  Review of Systems:   Review of Systems  Constitutional: Negative for fever, chills, weight loss, malaise/fatigue and diaphoresis.  HENT: Negative for hearing loss, ear pain, nosebleeds, congestion, sore throat, neck pain, tinnitus and ear discharge.   Eyes: Negative for blurred vision, double vision, photophobia, pain, discharge and redness.  Respiratory: Negative for cough, hemoptysis, sputum production, shortness of breath, wheezing and stridor.   Cardiovascular: Negative for chest pain, palpitations, orthopnea, claudication, leg swelling and PND.  Gastrointestinal: Positive for abdominal pain.  Negative for heartburn, nausea, vomiting, diarrhea, constipation, blood in stool and melena.  Genitourinary: Negative for dysuria, urgency, frequency, hematuria and flank pain.  Musculoskeletal: Negative for myalgias, back pain, joint pain and falls.  Skin: Negative for itching and rash.  Neurological: Negative for dizziness, tingling, tremors, sensory change, speech change, focal weakness, seizures, loss of consciousness, weakness and headaches.  Endo/Heme/Allergies: Negative for environmental allergies and polydipsia. Does not bruise/bleed easily.  Psychiatric/Behavioral: Negative for depression, suicidal ideas, hallucinations, memory loss and substance abuse. The patient is not nervous/anxious and does not have insomnia.      PHYSICAL EXAM:  Blood pressure 128/78, pulse 72, height 5\' 6"  (1.676 m), weight 179 lb (81.2 kg).    Vitals reviewed. Constitutional: She is oriented to person, place, and time. She appears well-developed and well-nourished.  HENT:  Head: Normocephalic and atraumatic.  Right Ear: External ear normal.  Left Ear: External ear normal.  Nose: Nose normal.  Mouth/Throat: Oropharynx is clear and moist.  Eyes: Conjunctivae and EOM are normal. Pupils are equal, round, and reactive to light. Right eye exhibits no discharge. Left eye exhibits no discharge. No scleral icterus.  Neck: Normal range of motion. Neck supple. No tracheal deviation present. No thyromegaly present.  Cardiovascular: Normal rate, regular rhythm, normal heart sounds and intact distal pulses.  Exam reveals no gallop and no friction rub.   No murmur heard. Respiratory: Effort normal and breath sounds normal. No respiratory distress. She has no wheezes. She has no rales. She exhibits no tenderness.  GI: Soft. Bowel sounds are normal. She exhibits no distension and no mass. There is tenderness. There is no rebound and no guarding.  Genitourinary:       Vulva is normal without lesions Vagina is pink  moist without discharge Cervix normal in appearance and pap is normal Uterus is normal size, contour, position, consistency, mobility, non-tender Adnexa is negative with normal sized ovaries by sonogram  Musculoskeletal: Normal range of motion. She exhibits no edema and no tenderness.  Neurological: She is alert and oriented to person, place, and time. She has normal reflexes. She displays normal reflexes. No cranial nerve deficit. She exhibits normal muscle tone. Coordination normal.  Skin: Skin is warm and dry. No rash noted. No erythema. No pallor.  Psychiatric: She has a normal mood and affect. Her behavior is normal. Judgment and thought content normal.    Labs: Results for orders placed or performed during the hospital encounter of 11/20/16 (from the past 168 hour(s))  Surgical pcr screen   Collection Time: 11/20/16  1:59 PM  Result Value Ref Range  MRSA, PCR NEGATIVE NEGATIVE   Staphylococcus aureus NEGATIVE NEGATIVE  CBC   Collection Time: 11/20/16  1:59 PM  Result Value Ref Range   WBC 7.3 4.0 - 10.5 K/uL   RBC 4.24 3.87 - 5.11 MIL/uL   Hemoglobin 13.6 12.0 - 15.0 g/dL   HCT 41.0 36.0 - 46.0 %   MCV 96.7 78.0 - 100.0 fL   MCH 32.1 26.0 - 34.0 pg   MCHC 33.2 30.0 - 36.0 g/dL   RDW 13.5 11.5 - 15.5 %   Platelets 378 150 - 400 K/uL  Comprehensive metabolic panel   Collection Time: 11/20/16  1:59 PM  Result Value Ref Range   Sodium 137 135 - 145 mmol/L   Potassium 3.5 3.5 - 5.1 mmol/L   Chloride 103 101 - 111 mmol/L   CO2 24 22 - 32 mmol/L   Glucose, Bld 86 65 - 99 mg/dL   BUN 12 6 - 20 mg/dL   Creatinine, Ser 0.63 0.44 - 1.00 mg/dL   Calcium 9.4 8.9 - 10.3 mg/dL   Total Protein 7.5 6.5 - 8.1 g/dL   Albumin 4.3 3.5 - 5.0 g/dL   AST 37 15 - 41 U/L   ALT 105 (H) 14 - 54 U/L   Alkaline Phosphatase 88 38 - 126 U/L   Total Bilirubin 0.8 0.3 - 1.2 mg/dL   GFR calc non Af Amer >60 >60 mL/min   GFR calc Af Amer >60 >60 mL/min   Anion gap 10 5 - 15  hCG, quantitative,  pregnancy   Collection Time: 11/20/16  1:59 PM  Result Value Ref Range   hCG, Beta Chain, Quant, S <1 <5 mIU/mL  Rapid HIV screen (HIV 1/2 Ab+Ag)   Collection Time: 11/20/16  1:59 PM  Result Value Ref Range   HIV-1 P24 Antigen - HIV24 NON REACTIVE NON REACTIVE   HIV 1/2 Antibodies NON REACTIVE NON REACTIVE   Interpretation (HIV Ag Ab)      A non reactive test result means that HIV 1 or HIV 2 antibodies and HIV 1 p24 antigen were not detected in the specimen.  Type and screen   Collection Time: 11/20/16  1:59 PM  Result Value Ref Range   ABO/RH(D) A POS    Antibody Screen NEG    Sample Expiration 12/04/2016    Extend sample reason NO TRANSFUSIONS OR PREGNANCY IN THE PAST 3 MONTHS   Urinalysis, Routine w reflex microscopic   Collection Time: 11/20/16  2:30 PM  Result Value Ref Range   Color, Urine STRAW (A) YELLOW   APPearance CLEAR CLEAR   Specific Gravity, Urine 1.002 (L) 1.005 - 1.030   pH 6.0 5.0 - 8.0   Glucose, UA NEGATIVE NEGATIVE mg/dL   Hgb urine dipstick MODERATE (A) NEGATIVE   Bilirubin Urine NEGATIVE NEGATIVE   Ketones, ur NEGATIVE NEGATIVE mg/dL   Protein, ur NEGATIVE NEGATIVE mg/dL   Nitrite NEGATIVE NEGATIVE   Leukocytes, UA NEGATIVE NEGATIVE   RBC / HPF 0-5 0 - 5 RBC/hpf   WBC, UA NONE SEEN 0 - 5 WBC/hpf   Bacteria, UA RARE (A) NONE SEEN   Squamous Epithelial / LPF 0-5 (A) NONE SEEN     EKG: No orders found for this or any previous visit.  Imaging Studies: ImagingResults  No results found.      Assessment: Menometrorrhagia Dysmenorrhea Right lower quadrant pain       Patient Active Problem List   Diagnosis Date Noted  . Pap smear abnormality of cervix  with ASCUS favoring benign 04/14/2015  . Chronic RLQ pain 10/29/2013  . Chronic pelvic pain in female 09/16/2013    Plan: TVH BSO 11/22/2016  Pt understands the risks of surgery including but not limited t  excessive bleeding requiring transfusion or reoperation, post-operative  infection requiring prolonged hospitalization or re-hospitalization and antibiotic therapy, and damage to other organs including bladder, bowel, ureters and major vessels.  The patient also understands the alternative treatment options which were discussed in full.  All questions were answered.  Florian Buff 11/09/2016 4:44 PM

## 2016-11-23 ENCOUNTER — Encounter (HOSPITAL_COMMUNITY): Payer: Self-pay | Admitting: Obstetrics & Gynecology

## 2016-11-23 NOTE — Anesthesia Postprocedure Evaluation (Signed)
Anesthesia Post Note  Patient: EVANELL REDLICH  Procedure(s) Performed: Procedure(s) (LRB): HYSTERECTOMY VAGINAL (N/A) BILATERAL SALPINGO OOPHORECTOMY (Bilateral)  Patient location during evaluation: Nursing Unit Anesthesia Type: General Level of consciousness: awake and alert and patient cooperative Pain management: pain level controlled Vital Signs Assessment: post-procedure vital signs reviewed and stable Respiratory status: spontaneous breathing, nonlabored ventilation and respiratory function stable Cardiovascular status: blood pressure returned to baseline Postop Assessment: no signs of nausea or vomiting Anesthetic complications: no     Last Vitals:  Vitals:   11/22/16 1330 11/22/16 1346  BP:  115/76  Pulse: 93 91  Resp: 16 18  Temp:  36.6 C    Last Pain:  Vitals:   11/22/16 1433  TempSrc:   PainSc: 5                  Aniko Finnigan J

## 2016-11-23 NOTE — Addendum Note (Signed)
Addendum  created 11/23/16 1020 by Charmaine Downs, CRNA   Sign clinical note

## 2016-11-30 ENCOUNTER — Ambulatory Visit (INDEPENDENT_AMBULATORY_CARE_PROVIDER_SITE_OTHER): Payer: Medicaid Other | Admitting: Obstetrics & Gynecology

## 2016-11-30 ENCOUNTER — Encounter: Payer: Self-pay | Admitting: Obstetrics & Gynecology

## 2016-11-30 ENCOUNTER — Telehealth: Payer: Self-pay | Admitting: Obstetrics & Gynecology

## 2016-11-30 VITALS — BP 106/78 | HR 74 | Wt 182.0 lb

## 2016-11-30 DIAGNOSIS — Z9889 Other specified postprocedural states: Secondary | ICD-10-CM

## 2016-11-30 DIAGNOSIS — Z90710 Acquired absence of both cervix and uterus: Secondary | ICD-10-CM

## 2016-11-30 NOTE — Progress Notes (Signed)
  HPI: Patient returns for routine postoperative follow-up having undergone TVH BSO on 11/22/2016.  The patient's immediate postoperative recovery has been unremarkable. Since hospital discharge the patient reports doing well pressure and some spotting.   Current Outpatient Prescriptions: albuterol (PROVENTIL HFA;VENTOLIN HFA) 108 (90 BASE) MCG/ACT inhaler, Inhale 1-2 puffs into the lungs every 6 (six) hours as needed for wheezing or shortness of breath., Disp: 1 Inhaler, Rfl: 0 ALPRAZolam (XANAX) 1 MG tablet, Take 1 mg by mouth 3 (three) times daily., Disp: , Rfl:  cetirizine (ZYRTEC) 10 MG tablet, Take 10 mg by mouth as needed for allergies., Disp: , Rfl:  ciprofloxacin (CIPRO) 500 MG tablet, Take 1 tablet (500 mg total) by mouth 2 (two) times daily., Disp: 14 tablet, Rfl: 0 hydrOXYzine (VISTARIL) 50 MG capsule, Take 50 mg by mouth 3 (three) times daily as needed for anxiety (sleep). , Disp: , Rfl:  loratadine (CLARITIN) 10 MG tablet, Take 10 mg by mouth daily., Disp: , Rfl:  montelukast (SINGULAIR) 10 MG tablet, Take 10 mg by mouth at bedtime., Disp: , Rfl:  oxyCODONE-acetaminophen (PERCOCET/ROXICET) 5-325 MG tablet, Take 1-2 tablets by mouth every 4 (four) hours as needed (moderate to severe pain (when tolerating fluids))., Disp: 40 tablet, Rfl: 0 simvastatin (ZOCOR) 40 MG tablet, Take 40 mg by mouth every evening. , Disp: , Rfl:  ketorolac (TORADOL) 10 MG tablet, Take 1 tablet (10 mg total) by mouth every 8 (eight) hours as needed. (Patient not taking: Reported on 11/30/2016), Disp: 15 tablet, Rfl: 0 ondansetron (ZOFRAN) 8 MG tablet, Take 1 tablet (8 mg total) by mouth every 6 (six) hours as needed for nausea. (Patient not taking: Reported on 11/30/2016), Disp: 20 tablet, Rfl: 0 theophylline (THEO-24) 300 MG 24 hr capsule, Take 1 capsule (300 mg total) by mouth 3 (three) times daily. (Patient not taking: Reported on 11/30/2016), Disp: 60 capsule, Rfl: 1  No current facility-administered  medications for this visit.     Blood pressure 106/78, pulse 74, weight 182 lb (82.6 kg).  Physical Exam: Cuff intact Abdomen is benign  Diagnostic Tests: none  Pathology: benign  Impression: S/P TVH BS)  Plan: Routine post op care no sex of course  Follow up: 4  weeks  Florian Buff, MD

## 2016-12-01 ENCOUNTER — Other Ambulatory Visit: Payer: Self-pay | Admitting: Obstetrics & Gynecology

## 2016-12-01 ENCOUNTER — Telehealth: Payer: Self-pay | Admitting: Obstetrics & Gynecology

## 2016-12-01 MED ORDER — ESTRADIOL 1 MG PO TABS: 2.0000 mg | ORAL_TABLET | Freq: Every day | ORAL | 11 refills | Status: DC

## 2016-12-01 NOTE — Telephone Encounter (Signed)
LMOVM that prescription was sent and received at [facility].

## 2016-12-01 NOTE — Telephone Encounter (Signed)
Pt called stating that her medication should have been called in yesterday when she was here. Pt would like for a nurse to give her a call back.

## 2016-12-22 ENCOUNTER — Encounter (HOSPITAL_COMMUNITY): Payer: Self-pay | Admitting: *Deleted

## 2016-12-22 ENCOUNTER — Emergency Department (HOSPITAL_COMMUNITY): Payer: Medicaid Other

## 2016-12-22 ENCOUNTER — Emergency Department (HOSPITAL_COMMUNITY)
Admission: EM | Admit: 2016-12-22 | Discharge: 2016-12-22 | Disposition: A | Discharge status: Home / Self Care | Payer: Medicaid Other | Attending: Emergency Medicine | Admitting: Emergency Medicine

## 2016-12-22 DIAGNOSIS — Z79899 Other long term (current) drug therapy: Secondary | ICD-10-CM | POA: Insufficient documentation

## 2016-12-22 DIAGNOSIS — M7989 Other specified soft tissue disorders: Secondary | ICD-10-CM

## 2016-12-22 DIAGNOSIS — X501XXA Overexertion from prolonged static or awkward postures, initial encounter: Secondary | ICD-10-CM | POA: Insufficient documentation

## 2016-12-22 DIAGNOSIS — Y999 Unspecified external cause status: Secondary | ICD-10-CM | POA: Diagnosis not present

## 2016-12-22 DIAGNOSIS — S93402A Sprain of unspecified ligament of left ankle, initial encounter: Secondary | ICD-10-CM | POA: Diagnosis not present

## 2016-12-22 DIAGNOSIS — F17210 Nicotine dependence, cigarettes, uncomplicated: Secondary | ICD-10-CM | POA: Insufficient documentation

## 2016-12-22 DIAGNOSIS — S93492A Sprain of other ligament of left ankle, initial encounter: Secondary | ICD-10-CM

## 2016-12-22 DIAGNOSIS — S99912A Unspecified injury of left ankle, initial encounter: Secondary | ICD-10-CM | POA: Diagnosis present

## 2016-12-22 DIAGNOSIS — Y929 Unspecified place or not applicable: Secondary | ICD-10-CM | POA: Insufficient documentation

## 2016-12-22 DIAGNOSIS — J45909 Unspecified asthma, uncomplicated: Secondary | ICD-10-CM | POA: Diagnosis not present

## 2016-12-22 DIAGNOSIS — S93432A Sprain of tibiofibular ligament of left ankle, initial encounter: Secondary | ICD-10-CM

## 2016-12-22 DIAGNOSIS — Y939 Activity, unspecified: Secondary | ICD-10-CM | POA: Diagnosis not present

## 2016-12-22 MED ORDER — ACETAMINOPHEN 325 MG PO TABS: 650.0000 mg | ORAL_TABLET | Freq: Four times a day (QID) | ORAL | 0 refills | Status: DC | PRN

## 2016-12-22 MED ORDER — IBUPROFEN 400 MG PO TABS
600.0000 mg | ORAL_TABLET | Freq: Once | ORAL | Status: AC
  Administered 2016-12-22: 600 mg via ORAL
  Filled 2016-12-22: qty 2

## 2016-12-22 MED ORDER — CEPHALEXIN 500 MG PO CAPS: 500.0000 mg | ORAL_CAPSULE | Freq: Four times a day (QID) | ORAL | 0 refills | Status: DC

## 2016-12-22 MED ORDER — IBUPROFEN 600 MG PO TABS: 600.0000 mg | ORAL_TABLET | Freq: Four times a day (QID) | ORAL | 0 refills | Status: DC | PRN

## 2016-12-22 MED ORDER — SULFAMETHOXAZOLE-TRIMETHOPRIM 800-160 MG PO TABS: 1.0000 | ORAL_TABLET | Freq: Two times a day (BID) | ORAL | 0 refills | Status: DC

## 2016-12-22 MED ORDER — HYDROCODONE-ACETAMINOPHEN 5-325 MG PO TABS
1.0000 | ORAL_TABLET | Freq: Once | ORAL | Status: AC
  Administered 2016-12-22: 1 via ORAL
  Filled 2016-12-22: qty 1

## 2016-12-22 MED ORDER — SULFAMETHOXAZOLE-TRIMETHOPRIM 800-160 MG PO TABS: 1.0000 | ORAL_TABLET | Freq: Two times a day (BID) | ORAL | 0 refills | Status: AC

## 2016-12-22 NOTE — ED Notes (Signed)
Pt taken to XR.  

## 2016-12-22 NOTE — ED Triage Notes (Signed)
Pt comes in with left foot and ankle injury. States this occurred last Saturday morning. Pt went to [facility] and told she had a sprain. Pt has had increasing pain and swelling since. Pt's foot became so swollen that she began to get blisters that have now popped.

## 2016-12-22 NOTE — ED Triage Notes (Signed)
Complains of L foot pain- states she injured it but answers no other questions- Is somewhat recalcitrant

## 2016-12-22 NOTE — Discharge Instructions (Signed)
You likely have ligamentous injury to the ankle - and it will be prudent that you see an Orthopedic doctor to ensure good  recovery. I would recommend that you put weight as tolerated on that ankle -and so I would like to use your crutches to help support the leg.  Keep the leg elevated. Ice the leg 4 times a day.  Finally, antibiotics given to cover for any infection that might have occurred. See the orthopedist in 1 week.

## 2016-12-22 NOTE — ED Notes (Signed)
Pt made aware to return if symptoms worsen or if any life threatening symptoms occur.  Pt taught to loosen ASO if toes turn blue or numb, pt verbalized understanding of instructions.

## 2016-12-22 NOTE — ED Notes (Signed)
Pt given ice.  

## 2016-12-22 NOTE — ED Provider Notes (Signed)
[facility] DEPT Provider Note   CSN: [id number] Arrival date & time: 12/22/16  1154     History   Chief Complaint Chief Complaint  Patient presents with  . Foot Injury    HPI Chloe Joyce is a 46 y.o. female.  HPI 46 y.o who comes in with cc of ankle pain, L side. Pt reports that she twisted her ankle, and she was placed on splint and discharged home. Pt has been having worsening swelling in her ankle and blisters formation. She also reports increased pain. Pt has been putting weight on the leg at this time and not using crutches.   Past Medical History:  Diagnosis Date  . Anxiety   . Arthritis   . Asthma   . Back pain   . Headache   . Hyperlipidemia     Patient Active Problem List   Diagnosis Date Noted  . S/P vaginal hysterectomy 11/22/2016  . Pap smear abnormality of cervix with ASCUS favoring benign 04/14/2015  . Chronic RLQ pain 10/29/2013  . Chronic pelvic pain in female 09/16/2013    Past Surgical History:  Procedure Laterality Date  . ABDOMINAL HYSTERECTOMY    . CESAREAN SECTION    . CHOLECYSTECTOMY    . HEMORROIDECTOMY    . SALPINGOOPHORECTOMY Bilateral 11/22/2016   Procedure: BILATERAL SALPINGO OOPHORECTOMY;  Surgeon: Florian Buff, MD;  Location: [facility];  Service: Gynecology;  Laterality: Bilateral;  . SHOULDER SURGERY Right   . TONSILLECTOMY AND ADENOIDECTOMY  1988  . TONSILLECTOMY AND ADENOIDECTOMY    . TUBAL LIGATION    . VAGINAL HYSTERECTOMY N/A 11/22/2016   Procedure: HYSTERECTOMY VAGINAL;  Surgeon: Florian Buff, MD;  Location: [facility];  Service: Gynecology;  Laterality: N/A;    OB History    No data available       Home Medications    Prior to Admission medications   Medication Sig Start Date End Date Taking? Authorizing Provider  acetaminophen (TYLENOL) 325 MG tablet Take 650 mg by mouth every 6 (six) hours as needed for mild pain.   Yes Historical Provider, MD  albuterol (PROVENTIL HFA;VENTOLIN HFA) 108 (90 BASE) MCG/ACT inhaler  Inhale 1-2 puffs into the lungs every 6 (six) hours as needed for wheezing or shortness of breath. 07/01/14  Yes Nat Christen, MD  ALPRAZolam Duanne Moron) 1 MG tablet Take 1 mg by mouth 3 (three) times daily.   Yes Historical Provider, MD  cetirizine (ZYRTEC) 10 MG tablet Take 10 mg by mouth as needed for allergies.   Yes Historical Provider, MD  estradiol (ESTRACE) 1 MG tablet Take 2 tablets (2 mg total) by mouth daily. 12/01/16  Yes Florian Buff, MD  hydrOXYzine (VISTARIL) 50 MG capsule Take 50 mg by mouth 3 (three) times daily as needed for anxiety (sleep).    Yes Historical Provider, MD  loratadine (CLARITIN) 10 MG tablet Take 10 mg by mouth daily.   Yes Historical Provider, MD  montelukast (SINGULAIR) 10 MG tablet Take 10 mg by mouth at bedtime.   Yes Historical Provider, MD  naproxen (NAPROSYN) 500 MG tablet Take 500 mg by mouth daily. Light yellow/tan pill   Yes Historical Provider, MD  simvastatin (ZOCOR) 40 MG tablet Take 40 mg by mouth every evening.    Yes Historical Provider, MD  theophylline (THEO-24) 300 MG 24 hr capsule Take 1 capsule (300 mg total) by mouth 3 (three) times daily. 07/01/14  Yes Nat Christen, MD  acetaminophen (TYLENOL) 325 MG tablet Take 2 tablets (650  mg total) by mouth every 6 (six) hours as needed. 12/22/16   Varney Biles, MD  cephALEXin (KEFLEX) 500 MG capsule Take 1 capsule (500 mg total) by mouth 4 (four) times daily. 12/22/16   Varney Biles, MD  ibuprofen (ADVIL,MOTRIN) 600 MG tablet Take 1 tablet (600 mg total) by mouth every 6 (six) hours as needed. 12/22/16   Varney Biles, MD  ketorolac (TORADOL) 10 MG tablet Take 1 tablet (10 mg total) by mouth every 8 (eight) hours as needed. Patient not taking: Reported on 11/30/2016 11/22/16   Florian Buff, MD  ondansetron (ZOFRAN) 8 MG tablet Take 1 tablet (8 mg total) by mouth every 6 (six) hours as needed for nausea. Patient not taking: Reported on 11/30/2016 11/22/16   Florian Buff, MD  oxyCODONE-acetaminophen (PERCOCET/ROXICET)  5-325 MG tablet Take 1-2 tablets by mouth every 4 (four) hours as needed (moderate to severe pain (when tolerating fluids)). Patient not taking: Reported on 12/22/2016 11/22/16   Florian Buff, MD  sulfamethoxazole-trimethoprim (BACTRIM DS,SEPTRA DS) 800-160 MG tablet Take 1 tablet by mouth 2 (two) times daily. 12/22/16 12/29/16  Varney Biles, MD    Family History Family History  Problem Relation Age of Onset  . Hypertension Mother   . Cancer Mother     breast  . Stroke Mother   . Hyperlipidemia Mother   . Varicose Veins Mother   . Miscarriages / [place] Mother   . Brain cancer Mother   . Asthma Brother   . Hypothyroidism Son   . Diabetes Maternal Grandmother   . Heart disease Other     Social History Social History  Substance Use Topics  . Smoking status: Current Every Day Smoker    Packs/day: 1.00    Years: 20.00    Types: Cigarettes  . Smokeless tobacco: Never Used  . Alcohol use Yes     Comment: occ     Allergies   Patient has no known allergies.   Review of Systems Review of Systems  Constitutional: Positive for activity change.  Musculoskeletal: Positive for myalgias.  Skin: Positive for rash.  Allergic/Immunologic: Negative for immunocompromised state.     Physical Exam Updated Vital Signs BP 134/75 (BP Location: Left Arm)   Pulse 96   Temp 98.5 F (36.9 C) (Oral)   Resp 16   Ht 5\' 6"  (1.676 m)   Wt 180 lb (81.6 kg)   LMP  (LMP Unknown)   SpO2 100%   BMI 29.05 kg/m   Physical Exam  Constitutional: She is oriented to person, place, and time. She appears well-developed.  HENT:  Head: Normocephalic and atraumatic.  Eyes: EOM are normal.  Neck: Normal range of motion. Neck supple. No JVD present.  Cardiovascular: Normal rate.   Pulmonary/Chest: Effort normal.  Abdominal: Bowel sounds are normal.  Musculoskeletal: She exhibits edema and tenderness.  Pt has edema of the LLE. There is lateral ankle tenderness. Pt also has tenderness over the  foot. Pt has distal tibia skin ulcer. Leg is warm to touch.  Neurological: She is alert and oriented to person, place, and time.  Skin: Skin is warm and dry.  Nursing note and vitals reviewed.    ED Treatments / Results  Labs (all labs ordered are listed, but only abnormal results are displayed) Labs Reviewed - No data to display  EKG  EKG Interpretation None       Radiology Dg Ankle Complete Left  Result Date: 12/22/2016 CLINICAL DATA:  Left ankle pain and swelling  after rolling injury last week. EXAM: LEFT ANKLE COMPLETE - 3+ VIEW COMPARISON:  None. FINDINGS: There is no evidence of fracture, dislocation, or joint effusion. There is no evidence of arthropathy or other focal bone abnormality. Soft tissue swelling is seen over the lateral malleolus suggesting ligamentous injury. IMPRESSION: No fracture or dislocation is noted. Soft tissue swelling seen over lateral malleolus suggesting ligamentous injury. Electronically Signed   By: Marijo Conception, M.D.   On: 12/22/2016 13:49   US Venous Img Lower Unilateral Left  Result Date: 12/22/2016 CLINICAL DATA:  Left ankle and foot pain. EXAM: LEFT LOWER EXTREMITY VENOUS DOPPLER ULTRASOUND TECHNIQUE: Gray-scale sonography with graded compression, as well as color Doppler and duplex ultrasound were performed to evaluate the lower extremity deep venous systems from the level of the common femoral vein and including the common femoral, femoral, profunda femoral, popliteal and calf veins including the posterior tibial, peroneal and gastrocnemius veins when visible. The superficial great saphenous vein was also interrogated. Spectral Doppler was utilized to evaluate flow at rest and with distal augmentation maneuvers in the common femoral, femoral and popliteal veins. COMPARISON:  None. FINDINGS: Contralateral Common Femoral Vein: Respiratory phasicity is normal and symmetric with the symptomatic side. No evidence of thrombus. Normal compressibility.  Common Femoral Vein: No evidence of thrombus. Saphenofemoral Junction: No evidence of thrombus. Profunda Femoral Vein: No evidence of thrombus. Femoral Vein: No evidence of thrombus. Popliteal Vein: No evidence of thrombus. Calf Veins: No evidence of thrombus. Superficial Great Saphenous Vein: No evidence of thrombus. Normal compressibility and flow on color Doppler imaging. Venous Reflux:  None. Other Findings:  Subcutaneous edema about the ankle. IMPRESSION: No evidence of DVT within the left lower extremity. Electronically Signed   By: Monte Fantasia M.D.   On: 12/22/2016 14:14    Procedures Procedures (including critical care time)  Medications Ordered in ED Medications  ibuprofen (ADVIL,MOTRIN) tablet 600 mg (600 mg Oral Given 12/22/16 1332)  HYDROcodone-acetaminophen (NORCO/VICODIN) 5-325 MG per tablet 1 tablet (1 tablet Oral Given 12/22/16 1332)     Initial Impression / Assessment and Plan / ED Course  I have reviewed the triage vital signs and the nursing notes.  Pertinent labs & imaging results that were available during my care of the patient were reviewed by me and considered in my medical decision making (see chart for details).  Clinical Course as of Dec 22 1513  Fri Dec 22, 2016  1512 Results from the ER workup discussed with the patient face to face and all questions answered to the best of my ability..  Crutches provided. Weight bearing as tolerated and RICE recommended. Ortho f/u recommended.  Antibiotics given for possible soft tissue infection.  DG Ankle Complete Left [AN]    Clinical Course User Index [AN] Varney Biles, MD    Pt comes in with cc of leg pain. She sprained her ankle last week, seen at OSH where she was splinted after a neg Xrays. Pt reports worsening of her pain and swelling. She has some redness as well, and open ulcer to the tibia that is minor - possibly due to swelling or due to blister. Pt has mild ecchymoses around her heels.  r/o DVT. If  DVT neg, start pt on antibiotics for possible soft tissue infection - mainly due to skin breakdown. ASO splint and Crutches if not fracture, otherwise we will splint.   Final Clinical Impressions(s) / ED Diagnoses   Final diagnoses:  High ankle sprain of left lower extremity, initial  encounter  Left leg swelling    New Prescriptions Discharge Medication List as of 12/22/2016  2:40 PM    START taking these medications   Details  cephALEXin (KEFLEX) 500 MG capsule Take 1 capsule (500 mg total) by mouth 4 (four) times daily., Starting Fri 12/22/2016, Print    sulfamethoxazole-trimethoprim (BACTRIM DS,SEPTRA DS) 800-160 MG tablet Take 1 tablet by mouth 2 (two) times daily., Starting Fri 12/22/2016, Until Fri 12/29/2016, Print         Varney Biles, MD 12/22/16 1515

## 2016-12-22 NOTE — ED Notes (Signed)
Dr. Kathrynn Humble at bedside explaining crutch use.

## 2016-12-22 NOTE — ED Notes (Signed)
Once back in room, pt pulled out splint and ace wrap that was applied from previous hospital visit.

## 2016-12-25 ENCOUNTER — Encounter (INDEPENDENT_AMBULATORY_CARE_PROVIDER_SITE_OTHER): Payer: Self-pay

## 2016-12-25 ENCOUNTER — Encounter: Payer: Self-pay | Admitting: Obstetrics & Gynecology

## 2016-12-25 ENCOUNTER — Ambulatory Visit (INDEPENDENT_AMBULATORY_CARE_PROVIDER_SITE_OTHER): Payer: Medicaid Other | Admitting: Obstetrics & Gynecology

## 2016-12-25 VITALS — BP 120/80 | HR 103 | Ht 66.0 in | Wt 186.0 lb

## 2016-12-25 DIAGNOSIS — Z90710 Acquired absence of both cervix and uterus: Secondary | ICD-10-CM

## 2016-12-25 DIAGNOSIS — Z029 Encounter for administrative examinations, unspecified: Secondary | ICD-10-CM

## 2016-12-25 MED ORDER — KETOROLAC TROMETHAMINE 10 MG PO TABS
10.0000 mg | ORAL_TABLET | Freq: Three times a day (TID) | ORAL | 0 refills | Status: DC | PRN
Start: 2016-12-25 — End: 2017-03-06

## 2016-12-25 NOTE — Progress Notes (Signed)
  HPI: Patient returns for routine postoperative follow-up having undergone [facility] BSO   on 11/22/2016.  The patient's immediate postoperative recovery has been unremarkable. Since hospital discharge the patient reports no problems, hot flushes have improved.   Current Outpatient Prescriptions: albuterol (PROVENTIL HFA;VENTOLIN HFA) 108 (90 BASE) MCG/ACT inhaler, Inhale 1-2 puffs into the lungs every 6 (six) hours as needed for wheezing or shortness of breath., Disp: 1 Inhaler, Rfl: 0 ALPRAZolam (XANAX) 1 MG tablet, Take 1 mg by mouth 3 (three) times daily., Disp: , Rfl:  cetirizine (ZYRTEC) 10 MG tablet, Take 10 mg by mouth as needed for allergies., Disp: , Rfl:  ciprofloxacin (CIPRO) 500 MG tablet, Take 1 tablet (500 mg total) by mouth 2 (two) times daily., Disp: 14 tablet, Rfl: 0 hydrOXYzine (VISTARIL) 50 MG capsule, Take 50 mg by mouth 3 (three) times daily as needed for anxiety (sleep). , Disp: , Rfl:  loratadine (CLARITIN) 10 MG tablet, Take 10 mg by mouth daily., Disp: , Rfl:  montelukast (SINGULAIR) 10 MG tablet, Take 10 mg by mouth at bedtime., Disp: , Rfl:  oxyCODONE-acetaminophen (PERCOCET/ROXICET) 5-325 MG tablet, Take 1-2 tablets by mouth every 4 (four) hours as needed (moderate to severe pain (when tolerating fluids))., Disp: 40 tablet, Rfl: 0 simvastatin (ZOCOR) 40 MG tablet, Take 40 mg by mouth every evening. , Disp: , Rfl:  ketorolac (TORADOL) 10 MG tablet, Take 1 tablet (10 mg total) by mouth every 8 (eight) hours as needed. (Patient not taking: Reported on 11/30/2016), Disp: 15 tablet, Rfl: 0 ondansetron (ZOFRAN) 8 MG tablet, Take 1 tablet (8 mg total) by mouth every 6 (six) hours as needed for nausea. (Patient not taking: Reported on 11/30/2016), Disp: 20 tablet, Rfl: 0 theophylline (THEO-24) 300 MG 24 hr capsule, Take 1 capsule (300 mg total) by mouth 3 (three) times daily. (Patient not taking: Reported on 11/30/2016), Disp: 60 capsule, Rfl: 1  No current facility-administered  medications for this visit.     Blood pressure 106/78, pulse 74, weight 182 lb (82.6 kg).  Physical Exam: Cuff healing well no masses non tender  Diagnostic Tests: none  Pathology: benign  Impression: s/p TVH BSO  Plan: No sex for 4 weeks  recheck  Follow up: 4  weeks  Florian Buff, MD

## 2016-12-25 NOTE — Addendum Note (Signed)
Addended by: Florian Buff on: 12/25/2016 04:35 PM   Modules accepted: Orders

## 2016-12-26 ENCOUNTER — Encounter: Payer: Medicaid Other | Admitting: Obstetrics & Gynecology

## 2017-01-01 ENCOUNTER — Telehealth: Payer: Self-pay | Admitting: Orthopedic Surgery

## 2017-01-01 NOTE — Telephone Encounter (Signed)
Patient called, inquiring about appointment for a left ankle injury/sprain.  States she called last week and doesn't think anyone called her back.  I had received the initial message, and returned the call, although did not reach patient at the time.  Relayed that we can schedule an appointment, however, referral from primary care provider is needed, per insurance requirement.  States she goes to [facility], and "that it will probably take a month."  I relayed we will glad to set up the visit as long as we receive a verbal or faxed referral.  Patient's ph# is [phone]

## 2017-01-15 ENCOUNTER — Emergency Department (HOSPITAL_COMMUNITY): Payer: Medicaid Other

## 2017-01-15 ENCOUNTER — Encounter (HOSPITAL_COMMUNITY): Payer: Self-pay | Admitting: *Deleted

## 2017-01-15 ENCOUNTER — Emergency Department (HOSPITAL_COMMUNITY)
Admission: EM | Admit: 2017-01-15 | Discharge: 2017-01-15 | Disposition: A | Discharge status: Home / Self Care | Payer: Medicaid Other | Attending: Emergency Medicine | Admitting: Emergency Medicine

## 2017-01-15 DIAGNOSIS — J45909 Unspecified asthma, uncomplicated: Secondary | ICD-10-CM | POA: Insufficient documentation

## 2017-01-15 DIAGNOSIS — M549 Dorsalgia, unspecified: Secondary | ICD-10-CM | POA: Insufficient documentation

## 2017-01-15 DIAGNOSIS — S6991XA Unspecified injury of right wrist, hand and finger(s), initial encounter: Secondary | ICD-10-CM | POA: Diagnosis present

## 2017-01-15 DIAGNOSIS — R4182 Altered mental status, unspecified: Secondary | ICD-10-CM | POA: Insufficient documentation

## 2017-01-15 DIAGNOSIS — Y999 Unspecified external cause status: Secondary | ICD-10-CM | POA: Diagnosis not present

## 2017-01-15 DIAGNOSIS — Y939 Activity, unspecified: Secondary | ICD-10-CM | POA: Diagnosis not present

## 2017-01-15 DIAGNOSIS — S60812A Abrasion of left wrist, initial encounter: Secondary | ICD-10-CM | POA: Insufficient documentation

## 2017-01-15 DIAGNOSIS — Y92410 Unspecified street and highway as the place of occurrence of the external cause: Secondary | ICD-10-CM | POA: Diagnosis not present

## 2017-01-15 DIAGNOSIS — S60811A Abrasion of right wrist, initial encounter: Secondary | ICD-10-CM | POA: Diagnosis not present

## 2017-01-15 DIAGNOSIS — T07XXXA Unspecified multiple injuries, initial encounter: Secondary | ICD-10-CM

## 2017-01-15 DIAGNOSIS — F17210 Nicotine dependence, cigarettes, uncomplicated: Secondary | ICD-10-CM | POA: Insufficient documentation

## 2017-01-15 MED ORDER — TETANUS-DIPHTH-ACELL PERTUSSIS 5-2.5-18.5 LF-MCG/0.5 IM SUSP
0.5000 mL | Freq: Once | INTRAMUSCULAR | Status: AC
  Administered 2017-01-15: 0.5 mL via INTRAMUSCULAR
  Filled 2017-01-15: qty 0.5
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
## 2017-01-15 NOTE — ED Notes (Signed)
Patient transported to X-ray & CT °

## 2017-01-15 NOTE — ED Notes (Signed)
Pt given coke, peanut butter, and crackers
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
## 2017-01-15 NOTE — Discharge Instructions (Signed)
Keep wounds clean and dry. Follow up with your doctor. Return to the ED if you develop new or worsening symptoms.  °

## 2017-01-23 ENCOUNTER — Encounter: Payer: Medicaid Other | Admitting: Obstetrics & Gynecology

## 2017-01-29 ENCOUNTER — Telehealth: Payer: Self-pay | Admitting: Obstetrics & Gynecology

## 2017-01-29 ENCOUNTER — Other Ambulatory Visit: Payer: Self-pay | Admitting: Obstetrics & Gynecology

## 2017-01-29 MED ORDER — ESTRADIOL 0.1 MG/24HR TD PTTW: 1.0000 | MEDICATED_PATCH | TRANSDERMAL | 12 refills | Status: DC

## 2017-01-29 NOTE — Telephone Encounter (Signed)
Patient states she has headaches, rash, leg and feet swelling and severe mood swings since starting the Estradiol. States "this is not working for me". Please call or advise.

## 2017-01-29 NOTE — Telephone Encounter (Signed)
Pt called stating that she would like one of her medication changed, estradiol (ESTRACE) 1 MG tablet. Please contact pt

## 2017-01-29 NOTE — Telephone Encounter (Signed)
Stop the oral estrogen  I have e prescribed vivelle dot patches and se if that works for patient

## 2017-01-29 NOTE — Telephone Encounter (Signed)
LMOVM returning call

## 2017-01-29 NOTE — Telephone Encounter (Signed)
LMOVM that oral medication was discontinued and to start patches.

## 2017-02-05 ENCOUNTER — Encounter: Payer: Medicaid Other | Admitting: Obstetrics & Gynecology

## 2017-02-15 ENCOUNTER — Encounter: Payer: Medicaid Other | Admitting: Obstetrics & Gynecology

## 2017-03-06 ENCOUNTER — Ambulatory Visit (INDEPENDENT_AMBULATORY_CARE_PROVIDER_SITE_OTHER): Payer: Medicaid Other | Admitting: Obstetrics & Gynecology

## 2017-03-06 ENCOUNTER — Encounter: Payer: Self-pay | Admitting: Obstetrics & Gynecology

## 2017-03-06 VITALS — BP 128/80 | HR 95 | Ht 66.0 in | Wt 183.0 lb

## 2017-03-06 DIAGNOSIS — R350 Frequency of micturition: Secondary | ICD-10-CM | POA: Diagnosis not present

## 2017-03-06 DIAGNOSIS — Z90710 Acquired absence of both cervix and uterus: Secondary | ICD-10-CM | POA: Diagnosis not present

## 2017-03-06 LAB — POCT URINALYSIS DIPSTICK
Glucose, UA: NEGATIVE
Ketones, UA: NEGATIVE
Leukocytes, UA: NEGATIVE
Nitrite, UA: NEGATIVE
Protein, UA: NEGATIVE

## 2017-03-06 MED ORDER — OXYBUTYNIN CHLORIDE 5 MG PO TABS: 5.0000 mg | ORAL_TABLET | Freq: Three times a day (TID) | ORAL | 11 refills | Status: DC

## 2017-03-06 MED ORDER — ESTRADIOL 0.1 MG/24HR TD PTTW: 1.0000 | MEDICATED_PATCH | TRANSDERMAL | 12 refills | Status: DC

## 2017-03-06 NOTE — Progress Notes (Signed)
  HPI: Patient returns for routine postoperative follow-up having undergone [facility] BSO  on 11/22/2016.  The patient's immediate postoperative recovery has been unremarkable. Since hospital discharge the patient reports no complaints.   Current Outpatient Prescriptions: albuterol (PROVENTIL HFA;VENTOLIN HFA) 108 (90 BASE) MCG/ACT inhaler, Inhale 1-2 puffs into the lungs every 6 (six) hours as needed for wheezing or shortness of breath., Disp: 1 Inhaler, Rfl: 0 ALPRAZolam (XANAX) 1 MG tablet, Take 1 mg by mouth 3 (three) times daily., Disp: , Rfl:  hydrOXYzine (VISTARIL) 50 MG capsule, Take 50 mg by mouth as needed for anxiety (sleep). , Disp: , Rfl:  naproxen (NAPROSYN) 500 MG tablet, Take 500 mg by mouth as needed. Light yellow/tan pill , Disp: , Rfl:  simvastatin (ZOCOR) 40 MG tablet, Take 40 mg by mouth every evening. , Disp: , Rfl:  theophylline (THEO-24) 300 MG 24 hr capsule, Take 1 capsule (300 mg total) by mouth 3 (three) times daily. (Patient taking differently: Take 300 mg by mouth as needed. ), Disp: 60 capsule, Rfl: 1  No current facility-administered medications for this visit.     Blood pressure 128/80, pulse 95, height 5\' 6"  (1.676 m), weight 183 lb (83 kg).  Physical Exam: Normal external genitalia Vagina is normal with intact cuff Bimanual reveals no midline or adnexal tenderness  Diagnostic Tests:   Pathology: Benign  Meds ordered this encounter  Medications  . oxybutynin (DITROPAN) 5 MG tablet    Sig: Take 1 tablet (5 mg total) by mouth 3 (three) times daily.    Dispense:  90 tablet    Refill:  11  . estradiol (VIVELLE-DOT) 0.1 MG/24HR patch    Sig: Place 1 patch (0.1 mg total) onto the skin 2 (two) times a week.    Dispense:  8 patch    Refill:  12    Impression: Status post vaginal hysterectomy with BSO  Plan: Return in about 1 year (around 03/06/2018) for yearly, with Dr Elonda Husky.   Follow up: As above  Florian Buff, MD   Meds ordered this encounter   Medications  . oxybutynin (DITROPAN) 5 MG tablet    Sig: Take 1 tablet (5 mg total) by mouth 3 (three) times daily.    Dispense:  90 tablet    Refill:  11  . estradiol (VIVELLE-DOT) 0.1 MG/24HR patch    Sig: Place 1 patch (0.1 mg total) onto the skin 2 (two) times a week.    Dispense:  8 patch    Refill:  12

## 2017-03-09 LAB — URINE CULTURE: Organism ID, Bacteria: NO GROWTH

## 2017-07-05 ENCOUNTER — Emergency Department (HOSPITAL_COMMUNITY)
Admission: EM | Admit: 2017-07-05 | Discharge: 2017-07-05 | Disposition: A | Discharge status: Home / Self Care | Payer: Medicaid Other | Attending: Emergency Medicine | Admitting: Emergency Medicine

## 2017-07-05 ENCOUNTER — Encounter (HOSPITAL_COMMUNITY): Payer: Self-pay | Admitting: Cardiology

## 2017-07-05 DIAGNOSIS — W01198A Fall on same level from slipping, tripping and stumbling with subsequent striking against other object, initial encounter: Secondary | ICD-10-CM | POA: Diagnosis not present

## 2017-07-05 DIAGNOSIS — Y929 Unspecified place or not applicable: Secondary | ICD-10-CM | POA: Diagnosis not present

## 2017-07-05 DIAGNOSIS — M545 Low back pain: Secondary | ICD-10-CM | POA: Insufficient documentation

## 2017-07-05 DIAGNOSIS — S51811A Laceration without foreign body of right forearm, initial encounter: Secondary | ICD-10-CM | POA: Diagnosis present

## 2017-07-05 DIAGNOSIS — Y998 Other external cause status: Secondary | ICD-10-CM | POA: Diagnosis not present

## 2017-07-05 DIAGNOSIS — G8929 Other chronic pain: Secondary | ICD-10-CM | POA: Diagnosis not present

## 2017-07-05 DIAGNOSIS — F17210 Nicotine dependence, cigarettes, uncomplicated: Secondary | ICD-10-CM | POA: Diagnosis not present

## 2017-07-05 DIAGNOSIS — J45909 Unspecified asthma, uncomplicated: Secondary | ICD-10-CM | POA: Diagnosis not present

## 2017-07-05 DIAGNOSIS — Y939 Activity, unspecified: Secondary | ICD-10-CM | POA: Diagnosis not present

## 2017-07-05 DIAGNOSIS — Z79899 Other long term (current) drug therapy: Secondary | ICD-10-CM | POA: Diagnosis not present

## 2017-07-05 DIAGNOSIS — W19XXXA Unspecified fall, initial encounter: Secondary | ICD-10-CM

## 2017-07-05 HISTORY — DX: Unspecified injury of unspecified ankle, initial encounter: S99.919A

## 2017-07-05 MED ORDER — LORAZEPAM 1 MG PO TABS
1.0000 mg | ORAL_TABLET | Freq: Once | ORAL | Status: AC
  Administered 2017-07-05: 1 mg via ORAL
  Filled 2017-07-05: qty 1

## 2017-07-05 MED ORDER — LIDOCAINE-EPINEPHRINE (PF) 2 %-1:200000 IJ SOLN
10.0000 mL | Freq: Once | INTRAMUSCULAR | Status: AC
  Administered 2017-07-05: 10 mL via INTRADERMAL
  Filled 2017-07-05: qty 20

## 2017-07-05 MED ORDER — HYDROCODONE-ACETAMINOPHEN 5-325 MG PO TABS
1.0000 | ORAL_TABLET | Freq: Once | ORAL | Status: AC
  Administered 2017-07-05: 1 via ORAL
  Filled 2017-07-05: qty 1

## 2017-07-05 NOTE — ED Triage Notes (Signed)
Pt states she slipped and fell at the door this morning.  Hit right wrist on door frame .  Laceration to right wrist.  Also c/o lower back pain.

## 2017-07-05 NOTE — Discharge Instructions (Signed)
Keep the wound clean with mild soap and water and keep it bandaged,. Sutures out in 8-10 days.  Return here for any signs of infection such as redness, swelling and drainage.

## 2017-07-05 NOTE — ED Provider Notes (Signed)
[facility] EMERGENCY DEPARTMENT Provider Note   CSN: [id number] Arrival date & time: 07/05/17  1478     History   Chief Complaint Chief Complaint  Patient presents with  . Laceration    HPI Chloe Joyce is a 46 y.o. female.  HPI   Chloe Joyce is a 46 y.o. female who presents to the Emergency Department complaining of laceration to her right forearm.  She states that she slipped on water and fell up against her door.  She states that believes the laceration occurred from striking her arm on the door frame.  She states she is cleaning the wound with tap water.  Complains of pain to her lower back, but states that her forearm is her main concern today.  She denies numbness, weakness, or tingling of the fingers of the right hand.  No shoulder pain or elbow pain.  She denies blood thinners.  Last tetanus is up-to-date.  She denies head injury or LOC.   Past Medical History:  Diagnosis Date  . Ankle injury   . Anxiety   . Arthritis   . Asthma   . Back pain   . Headache   . Hyperlipidemia     Patient Active Problem List   Diagnosis Date Noted  . S/P vaginal hysterectomy 11/22/2016  . Pap smear abnormality of cervix with ASCUS favoring benign 04/14/2015  . Chronic RLQ pain 10/29/2013  . Chronic pelvic pain in female 09/16/2013    Past Surgical History:  Procedure Laterality Date  . ABDOMINAL HYSTERECTOMY    . CESAREAN SECTION    . CHOLECYSTECTOMY    . HEMORROIDECTOMY    . SALPINGOOPHORECTOMY Bilateral 11/22/2016   Procedure: BILATERAL SALPINGO OOPHORECTOMY;  Surgeon: Florian Buff, MD;  Location: [facility];  Service: Gynecology;  Laterality: Bilateral;  . SHOULDER SURGERY Right   . TONSILLECTOMY AND ADENOIDECTOMY  1988  . TONSILLECTOMY AND ADENOIDECTOMY    . TUBAL LIGATION    . VAGINAL HYSTERECTOMY N/A 11/22/2016   Procedure: HYSTERECTOMY VAGINAL;  Surgeon: Florian Buff, MD;  Location: [facility];  Service: Gynecology;  Laterality: N/A;    OB History    Gravida Para  Term Preterm AB Living   6 4 4   2 3    SAB TAB Ectopic Multiple Live Births   1       3       Home Medications    Prior to Admission medications   Medication Sig Start Date End Date Taking? Authorizing Provider  albuterol (PROVENTIL HFA;VENTOLIN HFA) 108 (90 BASE) MCG/ACT inhaler Inhale 1-2 puffs into the lungs every 6 (six) hours as needed for wheezing or shortness of breath. 07/01/14  Yes Nat Christen, MD  ALPRAZolam Duanne Moron) 1 MG tablet Take 1 mg by mouth 3 (three) times daily.   Yes [provider]  fluticasone (FLONASE) 50 MCG/ACT nasal spray Place 2 sprays daily as needed into both nostrils for allergies or rhinitis.   Yes [provider]  hydrOXYzine (VISTARIL) 50 MG capsule Take 50 mg by mouth as needed for anxiety (sleep).    Yes [provider]  loratadine (CLARITIN) 10 MG tablet Take 10 mg daily by mouth.   Yes [provider]  montelukast (SINGULAIR) 10 MG tablet Take 10 mg daily by mouth.   Yes [provider]  naproxen (NAPROSYN) 500 MG tablet Take 500 mg by mouth as needed. Light yellow/tan pill    Yes [provider]  simvastatin (ZOCOR) 40 MG tablet  Take 40 mg by mouth every evening.    Yes [provider]  theophylline (THEO-24) 300 MG 24 hr capsule Take 1 capsule (300 mg total) by mouth 3 (three) times daily. Patient taking differently: Take 300 mg by mouth as needed.  07/01/14  Yes Nat Christen, MD  oxybutynin (DITROPAN) 5 MG tablet Take 1 tablet (5 mg total) by mouth 3 (three) times daily. Patient not taking: Reported on 07/05/2017 03/06/17   Florian Buff, MD    Family History Family History  Problem Relation Age of Onset  . Hypertension Mother   . Cancer Mother        breast  . Stroke Mother   . Hyperlipidemia Mother   . Varicose Veins Mother   . Miscarriages / [place] Mother   . Brain cancer Mother   . Asthma Brother   . Hypothyroidism Son   . Diabetes Maternal Grandmother   . Heart disease  Other     Social History Social History   Tobacco Use  . Smoking status: Current Every Day Smoker    Packs/day: 1.00    Years: 20.00    Pack years: 20.00    Types: Cigarettes  . Smokeless tobacco: Never Used  Substance Use Topics  . Alcohol use: Yes    Comment: occ  . Drug use: No     Allergies   Patient has no known allergies.   Review of Systems Review of Systems  Constitutional: Negative for chills and fever.  Musculoskeletal: Positive for back pain. Negative for arthralgias and joint swelling.  Skin: Positive for wound.       Laceration right wrist  Neurological: Negative for dizziness, syncope, weakness and numbness.  Hematological: Does not bruise/bleed easily.  All other systems reviewed and are negative.    Physical Exam Updated Vital Signs BP 105/81 (BP Location: Left Arm)   Pulse (!) 110   Temp 98.2 F (36.8 C) (Oral)   Ht 5\' 6"  (1.676 m)   Wt 81.6 kg (180 lb)   LMP  (LMP Unknown)   SpO2 96%   BMI 29.05 kg/m   Physical Exam  Constitutional: She is oriented to person, place, and time. She appears well-developed and well-nourished. No distress.  HENT:  Head: Normocephalic and atraumatic.  Cardiovascular: Normal rate, regular rhythm, normal heart sounds and intact distal pulses.  No murmur heard. Pulmonary/Chest: Effort normal and breath sounds normal. No respiratory distress.  Musculoskeletal: Normal range of motion. She exhibits no edema or tenderness.  Neurological: She is alert and oriented to person, place, and time. She has normal strength. No sensory deficit. She exhibits normal muscle tone. Coordination and gait normal. GCS eye subscore is 4. GCS verbal subscore is 5. GCS motor subscore is 6.  CN III-XII grossly intact  Skin: Skin is warm. Capillary refill takes less than 2 seconds. Laceration noted.  6 x 4 flap type laceration to the volar surface of the distal right forearm.  No edema.  Bleeding is controlled. No FB's or injury to the  deeper structures.  Psychiatric: She has a normal mood and affect.  Nursing note and vitals reviewed.    ED Treatments / Results  Labs (all labs ordered are listed, but only abnormal results are displayed) Labs Reviewed - No data to display  EKG  EKG Interpretation None       Radiology No results found.  Procedures Procedures (including critical care time)  LACERATION REPAIR Performed by: Cathaleen Korol L. Authorized by: Hale Bogus Consent: Verbal  consent obtained. Risks and benefits: risks, benefits and alternatives were discussed Consent given by: patient Patient identity confirmed: provided demographic data Prepped and Draped in normal sterile fashion Wound explored  Laceration Location: right forearm  Laceration Length:  6 x 4 cm flap, wound edges revised for better approximation  No Foreign Bodies seen or palpated  Anesthesia: local infiltration  Local anesthetic: lidocaine 2 % w/ epinephrine  Anesthetic total: 5 ml  Irrigation method: syringe Amount of cleaning: standard  Skin closure: 4-0 ethilon  Number of sutures: 14  Technique: simple interrupted  Patient tolerance: Patient tolerated the procedure well with no immediate complications.   Medications Ordered in ED Medications  lidocaine-EPINEPHrine (XYLOCAINE W/EPI) 2 %-1:200000 (PF) injection 10 mL (not administered)  LORazepam (ATIVAN) tablet 1 mg (1 mg Oral Given 07/05/17 0954)  HYDROcodone-acetaminophen (NORCO/VICODIN) 5-325 MG per tablet 1 tablet (1 tablet Oral Given 07/05/17 0954)     Initial Impression / Assessment and Plan / ED Course  I have reviewed the triage vital signs and the nursing notes.  Pertinent labs & imaging results that were available during my care of the patient were reviewed by me and considered in my medical decision making (see chart for details).     Patient is neurovascularly intact. Ambulatory with steady gait.   No focal neuro deficits.  Wound was  cleaned and irrigated, no foreign bodies or obvious injuries to deep structures were seen.  Does not appear to be self inflicted. Wound edges well approximated.  Dressing applied.  TD is up to date.       Final Clinical Impressions(s) / ED Diagnoses   Final diagnoses:  Laceration of right forearm, initial encounter  Fall, initial encounter    ED Discharge Orders    None       Kem Parkinson, PA-C 07/06/17 2022    Francine Graven, DO 07/09/17 2116

## 2017-07-16 ENCOUNTER — Emergency Department (HOSPITAL_COMMUNITY)
Admission: EM | Admit: 2017-07-16 | Discharge: 2017-07-16 | Disposition: A | Discharge status: Home / Self Care | Payer: Medicaid Other | Attending: Emergency Medicine | Admitting: Emergency Medicine

## 2017-07-16 ENCOUNTER — Encounter (HOSPITAL_COMMUNITY): Payer: Self-pay | Admitting: Emergency Medicine

## 2017-07-16 DIAGNOSIS — J45909 Unspecified asthma, uncomplicated: Secondary | ICD-10-CM | POA: Diagnosis not present

## 2017-07-16 DIAGNOSIS — Z5189 Encounter for other specified aftercare: Secondary | ICD-10-CM

## 2017-07-16 DIAGNOSIS — W268XXD Contact with other sharp object(s), not elsewhere classified, subsequent encounter: Secondary | ICD-10-CM | POA: Insufficient documentation

## 2017-07-16 DIAGNOSIS — Z79899 Other long term (current) drug therapy: Secondary | ICD-10-CM | POA: Diagnosis not present

## 2017-07-16 DIAGNOSIS — S51811D Laceration without foreign body of right forearm, subsequent encounter: Secondary | ICD-10-CM | POA: Insufficient documentation

## 2017-07-16 DIAGNOSIS — F17210 Nicotine dependence, cigarettes, uncomplicated: Secondary | ICD-10-CM | POA: Diagnosis not present

## 2017-07-16 DIAGNOSIS — Z4802 Encounter for removal of sutures: Secondary | ICD-10-CM

## 2017-07-16 NOTE — ED Provider Notes (Signed)
[facility] EMERGENCY DEPARTMENT Provider Note   CSN: [id number] Arrival date & time: 07/16/17  1757     History   Chief Complaint Chief Complaint  Patient presents with  . Wound Check    HPI Chloe Joyce is a 46 y.o. female who presents to the Emergency Department for a wound re-check and suture removal.  She reports that she was seen in the emergency department on 07/05/2017 for a laceration to the right forearm after she struck her hand on a door frame.  She had 14 simple interrupted sutures placed. Tdap was UTD.  She reports that after she was discharged from the emergency department that approximately 5 days later she began to note purulent drainage from the wound and was seen by her primary care provider and started on a course of Bactrim. She thinks she completed a 6-day course of Bactrim around 1-2 days ago.  She was seen earlier today by her primary care provider for suture removal and was encouraged to come to the emergency department for reevaluation because of concern for continued wound infection and wound dehiscence.  She denies fever, chills, drainage from the wound, red streaking, or warmth.  No h/o of DM.   The history is provided by the patient. No language interpreter was used.    Past Medical History:  Diagnosis Date  . Ankle injury   . Anxiety   . Arthritis   . Asthma   . Back pain   . Headache   . Hyperlipidemia     Patient Active Problem List   Diagnosis Date Noted  . S/P vaginal hysterectomy 11/22/2016  . Pap smear abnormality of cervix with ASCUS favoring benign 04/14/2015  . Chronic RLQ pain 10/29/2013  . Chronic pelvic pain in female 09/16/2013    Past Surgical History:  Procedure Laterality Date  . ABDOMINAL HYSTERECTOMY    . CESAREAN SECTION    . CHOLECYSTECTOMY    . HEMORROIDECTOMY    . SALPINGOOPHORECTOMY Bilateral 11/22/2016   Procedure: BILATERAL SALPINGO OOPHORECTOMY;  Surgeon: Florian Buff, MD;  Location: [facility];  Service:  Gynecology;  Laterality: Bilateral;  . SHOULDER SURGERY Right   . TONSILLECTOMY AND ADENOIDECTOMY  1988  . TONSILLECTOMY AND ADENOIDECTOMY    . TUBAL LIGATION    . VAGINAL HYSTERECTOMY N/A 11/22/2016   Procedure: HYSTERECTOMY VAGINAL;  Surgeon: Florian Buff, MD;  Location: [facility];  Service: Gynecology;  Laterality: N/A;    OB History    Gravida Para Term Preterm AB Living   6 4 4   2 3    SAB TAB Ectopic Multiple Live Births   1       3       Home Medications    Prior to Admission medications   Medication Sig Start Date End Date Taking? Authorizing Provider  albuterol (PROVENTIL HFA;VENTOLIN HFA) 108 (90 BASE) MCG/ACT inhaler Inhale 1-2 puffs into the lungs every 6 (six) hours as needed for wheezing or shortness of breath. 07/01/14   Nat Christen, MD  ALPRAZolam Duanne Moron) 1 MG tablet Take 1 mg by mouth 3 (three) times daily.    [provider]  fluticasone (FLONASE) 50 MCG/ACT nasal spray Place 2 sprays daily as needed into both nostrils for allergies or rhinitis.    [provider]  hydrOXYzine (VISTARIL) 50 MG capsule Take 50 mg by mouth as needed for anxiety (sleep).     [provider]  loratadine (CLARITIN) 10 MG tablet Take 10 mg daily by  mouth.    [provider]  montelukast (SINGULAIR) 10 MG tablet Take 10 mg daily by mouth.    [provider]  naproxen (NAPROSYN) 500 MG tablet Take 500 mg by mouth as needed. Light yellow/tan pill     [provider]  oxybutynin (DITROPAN) 5 MG tablet Take 1 tablet (5 mg total) by mouth 3 (three) times daily. Patient not taking: Reported on 07/05/2017 03/06/17   Florian Buff, MD  simvastatin (ZOCOR) 40 MG tablet Take 40 mg by mouth every evening.     [provider]  theophylline (THEO-24) 300 MG 24 hr capsule Take 1 capsule (300 mg total) by mouth 3 (three) times daily. Patient taking differently: Take 300 mg by mouth as needed.  07/01/14   Nat Christen, MD    Family  History Family History  Problem Relation Age of Onset  . Hypertension Mother   . Cancer Mother        breast  . Stroke Mother   . Hyperlipidemia Mother   . Varicose Veins Mother   . Miscarriages / [place] Mother   . Brain cancer Mother   . Asthma Brother   . Hypothyroidism Son   . Diabetes Maternal Grandmother   . Heart disease Other     Social History Social History   Tobacco Use  . Smoking status: Current Every Day Smoker    Packs/day: 1.00    Years: 20.00    Pack years: 20.00    Types: Cigarettes  . Smokeless tobacco: Never Used  Substance Use Topics  . Alcohol use: Yes    Comment: occ  . Drug use: No   Allergies   Patient has no known allergies.   Review of Systems Review of Systems  Constitutional: Negative for activity change, chills and fever.  Respiratory: Negative for shortness of breath.   Cardiovascular: Negative for chest pain.  Gastrointestinal: Negative for abdominal pain.  Musculoskeletal: Negative for back pain.  Skin: Positive for wound. Negative for color change, pallor and rash.     Physical Exam Updated Vital Signs LMP  (LMP Unknown)   Physical Exam  Constitutional: No distress.  HENT:  Head: Normocephalic.  Eyes: Conjunctivae are normal.  Neck: Neck supple.  Cardiovascular: Normal rate and regular rhythm. Exam reveals no gallop and no friction rub.  No murmur heard. Pulmonary/Chest: Effort normal. No respiratory distress.  Abdominal: Soft. She exhibits no distension.  Neurological: She is alert.  Skin: Skin is warm. No rash noted.  There is a 6 cm wound to the right forearm with ~1 cm of redness/granulation tissue.  No purulent drainage.  No wound dehiscence.  No red streaking or warmth.  Mild edema is noted to the distal wrist and thenar eminence. No erythema or warmth.   Psychiatric: Her behavior is normal.  Nursing note and vitals reviewed.    ED Treatments / Results  Labs (all labs ordered are listed, but only  abnormal results are displayed) Labs Reviewed - No data to display  EKG  EKG Interpretation None       Radiology No results found.  Procedures Procedures (including critical care time)  Medications Ordered in ED Medications - No data to display   Initial Impression / Assessment and Plan / ED Course  I have reviewed the triage vital signs and the nursing notes.  Pertinent labs & imaging results that were available during my care of the patient were reviewed by me and considered in my medical decision making (see  chart for details).     46 year old female presenting for a wound recheck and suture removal.  On physical exam, the wound appears to be well healing with surrounding granulation tissue.  No purulent drainage.  No red streaking or surrounding cellulitis.  The patient was seen and evaluated with Dr. Eulis Foster, attending physician.  Sutures removed in the ED. Discussed with the patient that additional antibiotics are not indicated at this time.  After suture wound care instructions fully explained to the patient.  Strict return precautions given.  No acute distress.  Patient is safe for discharge at this time.  Final Clinical Impressions(s) / ED Diagnoses   Final diagnoses:  Encounter for wound re-check  Visit for suture removal    ED Discharge Orders    None       Etola Mull A, PA-C 07/17/17 [id number]    Daleen Bo, MD 07/17/17 1326

## 2017-07-16 NOTE — ED Triage Notes (Signed)
PT states she went to [facility] for follow up from sutures on 07/06/17 and was told she may need antibiotics and to come to ED for eval.

## 2017-07-16 NOTE — Discharge Instructions (Signed)
Please clean the wound daily with warm soap and water.  Pat the area dry, then you can apply a topical antibiotic such as Neosporin.  You can apply a gauze dressing daily, particularly if you are going to be outside and working with your dogs, until the wound fully heals.  You can take anti-inflammatory such as ibuprofen or Tylenol for pain control.  Please apply ice for 15-20 minutes up to 3-4 times a day to help with the swelling of the wrist.  If you develop a fever or chills, or if the wound becomes hot, swelling, or develops red streaks, please return to the emergency department or follow-up with your primary care provider for re-evaluation.

## 2017-07-16 NOTE — ED Notes (Signed)
Pt says here to have stitches removed from forearm. Wound pink around area not warmth or drainage noted.

## 2017-07-16 NOTE — ED Notes (Signed)
Pt alert & oriented x4, stable gait. Patient  given discharge instructions, paperwork & prescription(s). Patient verbalized understanding. Pt left department w/ no further questions. 

## 2017-07-16 NOTE — ED Provider Notes (Signed)
  Face-to-face evaluation   History: She presents for evaluation of wound assessment, suture removal and possible infection  Physical exam: Left forearm wound, volar aspect, sutures in tact, wound edges approximated, mild surrounding erythema but no fluctuance, drainage or bleeding.  No proximal or distal redness or concern for infection.   Medical screening examination/treatment/procedure(s) were conducted as a shared visit with non-physician practitioner(s) and myself.  I personally evaluated the patient during the encounter     Daleen Bo, MD 07/17/17 1326
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
## 2018-01-12 ENCOUNTER — Other Ambulatory Visit: Payer: Self-pay

## 2018-01-12 ENCOUNTER — Emergency Department (HOSPITAL_COMMUNITY)
Admission: EM | Admit: 2018-01-12 | Discharge: 2018-01-12 | Disposition: A | Discharge status: Home / Self Care | Payer: Medicaid Other | Attending: Emergency Medicine | Admitting: Emergency Medicine

## 2018-01-12 ENCOUNTER — Encounter (HOSPITAL_COMMUNITY): Payer: Self-pay

## 2018-01-12 ENCOUNTER — Emergency Department (HOSPITAL_COMMUNITY): Payer: Medicaid Other

## 2018-01-12 DIAGNOSIS — Y999 Unspecified external cause status: Secondary | ICD-10-CM | POA: Diagnosis not present

## 2018-01-12 DIAGNOSIS — Y9389 Activity, other specified: Secondary | ICD-10-CM | POA: Diagnosis not present

## 2018-01-12 DIAGNOSIS — J45909 Unspecified asthma, uncomplicated: Secondary | ICD-10-CM | POA: Insufficient documentation

## 2018-01-12 DIAGNOSIS — F17210 Nicotine dependence, cigarettes, uncomplicated: Secondary | ICD-10-CM | POA: Insufficient documentation

## 2018-01-12 DIAGNOSIS — S46911A Strain of unspecified muscle, fascia and tendon at shoulder and upper arm level, right arm, initial encounter: Secondary | ICD-10-CM

## 2018-01-12 DIAGNOSIS — Y929 Unspecified place or not applicable: Secondary | ICD-10-CM | POA: Diagnosis not present

## 2018-01-12 DIAGNOSIS — S4991XA Unspecified injury of right shoulder and upper arm, initial encounter: Secondary | ICD-10-CM | POA: Diagnosis present

## 2018-01-12 DIAGNOSIS — Z79899 Other long term (current) drug therapy: Secondary | ICD-10-CM | POA: Insufficient documentation

## 2018-01-12 MED ORDER — CYCLOBENZAPRINE HCL 10 MG PO TABS: 10.0000 mg | ORAL_TABLET | Freq: Three times a day (TID) | ORAL | 0 refills | Status: AC | PRN

## 2018-01-12 MED ORDER — NAPROXEN 500 MG PO TABS: 500.0000 mg | ORAL_TABLET | Freq: Two times a day (BID) | ORAL | 0 refills | Status: DC
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
## 2018-01-12 NOTE — ED Notes (Signed)
Pt left facility before sling application or medication admin

## 2018-01-12 NOTE — Discharge Instructions (Addendum)
Wear arm sling for the next several days.  Naproxen and Flexeril as prescribed.  Follow-up with your primary doctor if symptoms are not improving in the next week.

## 2018-02-05 ENCOUNTER — Other Ambulatory Visit: Payer: Self-pay | Admitting: Student

## 2018-02-05 DIAGNOSIS — M545 Low back pain: Secondary | ICD-10-CM

## 2018-02-08 ENCOUNTER — Ambulatory Visit: Payer: Medicaid Other

## 2018-03-28 ENCOUNTER — Ambulatory Visit (HOSPITAL_COMMUNITY)
Admission: RE | Admit: 2018-03-28 | Discharge: 2018-03-28 | Disposition: A | Discharge status: Home / Self Care | Payer: Medicaid Other | Source: Ambulatory Visit | Attending: Student | Admitting: Student

## 2018-03-28 DIAGNOSIS — M2548 Effusion, other site: Secondary | ICD-10-CM | POA: Diagnosis not present

## 2018-03-28 DIAGNOSIS — M5127 Other intervertebral disc displacement, lumbosacral region: Secondary | ICD-10-CM | POA: Diagnosis not present

## 2018-03-28 DIAGNOSIS — M545 Low back pain: Secondary | ICD-10-CM | POA: Diagnosis present

## 2018-03-28 DIAGNOSIS — M48061 Spinal stenosis, lumbar region without neurogenic claudication: Secondary | ICD-10-CM | POA: Insufficient documentation

## 2018-03-28 DIAGNOSIS — M47816 Spondylosis without myelopathy or radiculopathy, lumbar region: Secondary | ICD-10-CM | POA: Insufficient documentation

## 2018-05-09 ENCOUNTER — Ambulatory Visit (INDEPENDENT_AMBULATORY_CARE_PROVIDER_SITE_OTHER): Payer: Medicaid Other | Admitting: Otolaryngology

## 2018-05-09 DIAGNOSIS — H903 Sensorineural hearing loss, bilateral: Secondary | ICD-10-CM

## 2018-05-29 ENCOUNTER — Other Ambulatory Visit (HOSPITAL_COMMUNITY): Payer: Self-pay | Admitting: Emergency Medicine

## 2018-05-29 DIAGNOSIS — R229 Localized swelling, mass and lump, unspecified: Principal | ICD-10-CM

## 2018-05-29 DIAGNOSIS — IMO0002 Reserved for concepts with insufficient information to code with codable children: Secondary | ICD-10-CM

## 2018-06-11 ENCOUNTER — Encounter (HOSPITAL_COMMUNITY): Payer: Medicaid Other

## 2018-06-11 ENCOUNTER — Ambulatory Visit (HOSPITAL_COMMUNITY): Payer: Medicaid Other

## 2018-06-11 ENCOUNTER — Encounter (HOSPITAL_COMMUNITY): Payer: Self-pay

## 2018-06-25 ENCOUNTER — Encounter (HOSPITAL_COMMUNITY): Payer: Medicaid Other

## 2018-07-02 ENCOUNTER — Ambulatory Visit (HOSPITAL_COMMUNITY)
Admission: RE | Admit: 2018-07-02 | Discharge: 2018-07-02 | Disposition: A | Discharge status: Home / Self Care | Payer: Medicaid Other | Source: Ambulatory Visit | Attending: Emergency Medicine | Admitting: Emergency Medicine

## 2018-07-02 ENCOUNTER — Ambulatory Visit (HOSPITAL_COMMUNITY): Admission: RE | Admit: 2018-07-02 | Payer: Medicaid Other | Source: Ambulatory Visit

## 2018-07-02 ENCOUNTER — Encounter (HOSPITAL_COMMUNITY): Payer: Self-pay

## 2018-07-02 DIAGNOSIS — R229 Localized swelling, mass and lump, unspecified: Secondary | ICD-10-CM | POA: Diagnosis present

## 2018-07-02 DIAGNOSIS — IMO0002 Reserved for concepts with insufficient information to code with codable children: Secondary | ICD-10-CM

## 2019-07-10 ENCOUNTER — Other Ambulatory Visit (HOSPITAL_COMMUNITY): Payer: Self-pay | Admitting: Emergency Medicine

## 2019-07-10 DIAGNOSIS — Z1231 Encounter for screening mammogram for malignant neoplasm of breast: Secondary | ICD-10-CM

## 2019-10-24 ENCOUNTER — Emergency Department (HOSPITAL_COMMUNITY): Payer: Medicaid Other

## 2019-10-24 ENCOUNTER — Encounter (HOSPITAL_COMMUNITY): Payer: Self-pay | Admitting: Emergency Medicine

## 2019-10-24 ENCOUNTER — Other Ambulatory Visit: Payer: Self-pay

## 2019-10-24 ENCOUNTER — Emergency Department (HOSPITAL_COMMUNITY)
Admission: EM | Admit: 2019-10-24 | Discharge: 2019-10-24 | Disposition: A | Discharge status: Home / Self Care | Payer: Medicaid Other | Attending: Emergency Medicine | Admitting: Emergency Medicine

## 2019-10-24 DIAGNOSIS — J40 Bronchitis, not specified as acute or chronic: Secondary | ICD-10-CM

## 2019-10-24 DIAGNOSIS — R5383 Other fatigue: Secondary | ICD-10-CM | POA: Diagnosis not present

## 2019-10-24 DIAGNOSIS — F17210 Nicotine dependence, cigarettes, uncomplicated: Secondary | ICD-10-CM | POA: Diagnosis not present

## 2019-10-24 DIAGNOSIS — J45909 Unspecified asthma, uncomplicated: Secondary | ICD-10-CM | POA: Insufficient documentation

## 2019-10-24 DIAGNOSIS — R0602 Shortness of breath: Secondary | ICD-10-CM | POA: Diagnosis present

## 2019-10-24 MED ORDER — AZITHROMYCIN 250 MG PO TABS
500.0000 mg | ORAL_TABLET | Freq: Once | ORAL | Status: AC
  Administered 2019-10-24: 500 mg via ORAL
  Filled 2019-10-24: qty 2

## 2019-10-24 MED ORDER — AZITHROMYCIN 250 MG PO TABS: 250.0000 mg | ORAL_TABLET | Freq: Every day | ORAL | 0 refills | Status: DC

## 2019-10-24 NOTE — ED Triage Notes (Addendum)
Pt stating "I need an X-ray and an antibiotic. I have pneumonia on my right side. I've had this before I know what it is." Pt speaking in full sentences in no obvious distress. Denies fevers.

## 2019-10-24 NOTE — Discharge Instructions (Addendum)
Take your next dose of the antibiotic tomorrow evening. Follow up with your primary MD if your symptoms do not improve with this treatment.

## 2019-10-25 NOTE — ED Provider Notes (Signed)
[facility] EMERGENCY DEPARTMENT Provider Note   CSN: [id number] Arrival date & time: 10/24/19  1942     History Chief Complaint  Patient presents with  . Shortness of Breath    Chloe Joyce is a 49 y.o. female with a history of asthma, currently a 1ppd smoker, anxiety, hyperlipidemia and history of pneumonia presenting with right lateral to mid back discomfort which started gradually over the past week.  Described as "soreness" which is constant but worsened with movement along cough productive of yellow sputum.  This reminds her of her last episode of pneumonia which she gets yearly.  She does have intermittent wheezing which is fairly well controlled using her albuterol inhaler, states her sob is baseline. Denies peripheral edema, denies back or chest wall injury.    The history is provided by the patient.       Past Medical History:  Diagnosis Date  . Ankle injury   . Anxiety   . Arthritis   . Asthma   . Back pain   . Headache   . Hyperlipidemia     Patient Active Problem List   Diagnosis Date Noted  . S/P vaginal hysterectomy 11/22/2016  . Pap smear abnormality of cervix with ASCUS favoring benign 04/14/2015  . Chronic RLQ pain 10/29/2013  . Chronic pelvic pain in female 09/16/2013    Past Surgical History:  Procedure Laterality Date  . ABDOMINAL HYSTERECTOMY    . CESAREAN SECTION    . CHOLECYSTECTOMY    . HEMORROIDECTOMY    . SALPINGOOPHORECTOMY Bilateral 11/22/2016   Procedure: BILATERAL SALPINGO OOPHORECTOMY;  Surgeon: Florian Buff, MD;  Location: [facility];  Service: Gynecology;  Laterality: Bilateral;  . SHOULDER SURGERY Right   . TONSILLECTOMY AND ADENOIDECTOMY  1988  . TONSILLECTOMY AND ADENOIDECTOMY    . TUBAL LIGATION    . VAGINAL HYSTERECTOMY N/A 11/22/2016   Procedure: HYSTERECTOMY VAGINAL;  Surgeon: Florian Buff, MD;  Location: [facility];  Service: Gynecology;  Laterality: N/A;     OB History    Gravida  6   Para  4   Term  4   Preterm      AB   2   Living  3     SAB  1   TAB      Ectopic      Multiple      Live Births  3           Family History  Problem Relation Age of Onset  . Hypertension Mother   . Cancer Mother        breast  . Stroke Mother   . Hyperlipidemia Mother   . Varicose Veins Mother   . Miscarriages / [place] Mother   . Brain cancer Mother   . Asthma Brother   . Hypothyroidism Son   . Diabetes Maternal Grandmother   . Heart disease Other     Social History   Tobacco Use  . Smoking status: Current Every Day Smoker    Packs/day: 1.00    Years: 20.00    Pack years: 20.00    Types: Cigarettes  . Smokeless tobacco: Never Used  Substance Use Topics  . Alcohol use: Yes    Comment: occ  . Drug use: No    Home Medications Prior to Admission medications   Medication Sig Start Date End Date Taking? Authorizing Provider  albuterol (PROVENTIL HFA;VENTOLIN HFA) 108 (90 BASE) MCG/ACT inhaler Inhale 1-2 puffs into the lungs every 6 (six)  hours as needed for wheezing or shortness of breath. 07/01/14   Nat Christen, MD  ALPRAZolam Duanne Moron) 1 MG tablet Take 1 mg by mouth 3 (three) times daily.    [provider]  azithromycin (ZITHROMAX) 250 MG tablet Take 1 tablet (250 mg total) by mouth daily. Take one tablet daily for 4 days 10/24/19   Evalee Jefferson, PA-C  cyclobenzaprine (FLEXERIL) 10 MG tablet Take 1 tablet (10 mg total) by mouth 3 (three) times daily as needed for muscle spasms. 01/12/18   Veryl Speak, MD  fluticasone (FLONASE) 50 MCG/ACT nasal spray Place 2 sprays daily as needed into both nostrils for allergies or rhinitis.    [provider]  hydrOXYzine (VISTARIL) 50 MG capsule Take 50 mg by mouth as needed for anxiety (sleep).     [provider]  loratadine (CLARITIN) 10 MG tablet Take 10 mg daily by mouth.    [provider]  montelukast (SINGULAIR) 10 MG tablet Take 10 mg daily by mouth.    [provider]  naproxen (NAPROSYN) 500 MG tablet  Take 1 tablet (500 mg total) by mouth 2 (two) times daily. 01/12/18   Veryl Speak, MD  oxybutynin (DITROPAN) 5 MG tablet Take 1 tablet (5 mg total) by mouth 3 (three) times daily. Patient not taking: Reported on 07/05/2017 03/06/17   Florian Buff, MD  simvastatin (ZOCOR) 40 MG tablet Take 40 mg by mouth every evening.     [provider]  theophylline (THEO-24) 300 MG 24 hr capsule Take 1 capsule (300 mg total) by mouth 3 (three) times daily. Patient taking differently: Take 300 mg by mouth as needed.  07/01/14   Nat Christen, MD    Allergies    Patient has no known allergies.  Review of Systems   Review of Systems  Constitutional: Positive for fatigue. Negative for fever.  HENT: Negative for congestion and sore throat.   Eyes: Negative.   Respiratory: Positive for cough and shortness of breath. Negative for chest tightness and wheezing.   Cardiovascular: Negative for chest pain, palpitations and leg swelling.  Gastrointestinal: Negative for abdominal pain and nausea.  Genitourinary: Negative.   Musculoskeletal: Positive for back pain. Negative for arthralgias, joint swelling and neck pain.  Skin: Negative.  Negative for rash and wound.  Neurological: Negative for dizziness, weakness, light-headedness, numbness and headaches.  Psychiatric/Behavioral: Negative.     Physical Exam Updated Vital Signs BP 115/85 (BP Location: Right Arm)   Pulse 90   Temp (!) 97.5 F (36.4 C) (Oral)   Resp 17   Ht 5\' 6"  (1.676 m)   Wt 77.1 kg   LMP  (LMP Unknown)   SpO2 99%   BMI 27.44 kg/m   Physical Exam Vitals and nursing note reviewed.  Constitutional:      General: She is not in acute distress.    Appearance: She is well-developed.  HENT:     Head: Normocephalic and atraumatic.  Eyes:     Conjunctiva/sclera: Conjunctivae normal.  Cardiovascular:     Rate and Rhythm: Normal rate and regular rhythm.     Heart sounds: Normal heart sounds.  Pulmonary:     Effort: Pulmonary  effort is normal.     Breath sounds: Examination of the right-lower field reveals rhonchi. Decreased breath sounds and rhonchi present. No wheezing.  Chest:     Chest wall: Tenderness present.     Comments: Mild tenderness to palpation right posterolateral ribcage. Abdominal:     General: Bowel  sounds are normal.     Palpations: Abdomen is soft.     Tenderness: There is no abdominal tenderness.  Musculoskeletal:        General: Normal range of motion.     Cervical back: Normal range of motion.     Right lower leg: No tenderness. No edema.     Left lower leg: No tenderness. No edema.  Skin:    General: Skin is warm and dry.  Neurological:     Mental Status: She is alert.     ED Results / Procedures / Treatments   Labs (all labs ordered are listed, but only abnormal results are displayed) Labs Reviewed - No data to display  EKG None  Radiology DG Chest 2 View  Result Date: 10/24/2019 CLINICAL DATA:  Dyspnea. EXAM: CHEST - 2 VIEW COMPARISON:  August 28, 2014. FINDINGS: The cardiomediastinal silhouette is normal. There is increased retrosternal clear space, but the hemidiaphragms remain well-curved. There is no consolidation, interstitial edema, pleural effusion or pneumothorax. The breast soft tissues project both lower lung zones. Subtle reverse S-shaped spinal curvature, lower cervical mild facet arthropathy, and right shoulder periarticular calcifications are present. IMPRESSION: 1. No radiographic evidence of acute cardiopulmonary disease. 2. Subtle reverse S-shaped spinal curvature, with right shoulder periarticular calcifications. Electronically Signed   By: Revonda Humphrey   On: 10/24/2019 20:57    Procedures Procedures (including critical care time)  Medications Ordered in ED Medications  azithromycin (ZITHROMAX) tablet 500 mg (500 mg Oral Given 10/24/19 2240)    ED Course  I have reviewed the triage vital signs and the nursing notes.  Pertinent labs & imaging results  that were available during my care of the patient were reviewed by me and considered in my medical decision making (see chart for details).    MDM Rules/Calculators/A&P                      Pt with increased cough with right lower lung field rhonchi on exam, no cxr findings to suggest pneumonia. Her pain is reproducible right posterior ribcage.  Possible soreness secondary to strain from cough.  No findings per exam or hx to suggest PE. Given smoking hx will cover with zithromax, first dose given here.  No hypoxia. Advised continued albuterol mdi prn.  Plan close f/u with pcp for persistent or worsened sx.  Return precautions discussed.   Also discussed covid screening, pt reports no possible exposures. Declines screening.  Chloe Joyce was evaluated in Emergency Department on 10/25/2019 for the symptoms described in the history of present illness. She was evaluated in the context of the global COVID-19 pandemic, which necessitated consideration that the patient might be at risk for infection with the SARS-CoV-2 virus that causes COVID-19. Institutional protocols and algorithms that pertain to the evaluation of patients at risk for COVID-19 are in a state of rapid change based on information released by regulatory bodies including the CDC and federal and state organizations. These policies and algorithms were followed during the patient's care in the ED.  Final Clinical Impression(s) / ED Diagnoses Final diagnoses:  Bronchitis    Rx / DC Orders ED Discharge Orders         Ordered    azithromycin (ZITHROMAX) 250 MG tablet  Daily     10/24/19 2223           Evalee Jefferson, PA-C 10/25/19 1520    Truddie Hidden, MD 10/25/19 2311

## 2020-01-05 ENCOUNTER — Ambulatory Visit (HOSPITAL_COMMUNITY): Payer: Medicaid Other

## 2020-06-24 ENCOUNTER — Encounter: Payer: Self-pay | Admitting: Internal Medicine

## 2020-08-10 ENCOUNTER — Ambulatory Visit: Payer: Medicaid Other | Admitting: Nurse Practitioner

## 2020-09-06 ENCOUNTER — Encounter (HOSPITAL_COMMUNITY): Payer: Self-pay | Admitting: *Deleted

## 2020-09-06 ENCOUNTER — Emergency Department (HOSPITAL_COMMUNITY)
Admission: EM | Admit: 2020-09-06 | Discharge: 2020-09-06 | Disposition: A | Discharge status: Home / Self Care | Payer: Medicaid Other | Attending: Emergency Medicine | Admitting: Emergency Medicine

## 2020-09-06 DIAGNOSIS — R0602 Shortness of breath: Secondary | ICD-10-CM | POA: Diagnosis present

## 2020-09-06 DIAGNOSIS — F17210 Nicotine dependence, cigarettes, uncomplicated: Secondary | ICD-10-CM | POA: Insufficient documentation

## 2020-09-06 DIAGNOSIS — U071 COVID-19: Secondary | ICD-10-CM | POA: Insufficient documentation

## 2020-09-06 DIAGNOSIS — J45909 Unspecified asthma, uncomplicated: Secondary | ICD-10-CM | POA: Diagnosis not present

## 2020-09-06 MED ORDER — BENZONATATE 100 MG PO CAPS: 100.0000 mg | ORAL_CAPSULE | Freq: Three times a day (TID) | ORAL | 0 refills | Status: DC

## 2020-09-06 MED ORDER — PREDNISONE 20 MG PO TABS: 40.0000 mg | ORAL_TABLET | Freq: Every day | ORAL | 0 refills | Status: AC

## 2020-09-06 NOTE — ED Provider Notes (Signed)
[facility] EMERGENCY DEPARTMENT Provider Note   CSN: [id number] Arrival date & time: 09/06/20  1523     History Chief Complaint  Patient presents with  . Shortness of Breath    Chloe Joyce is a 50 y.o. female with a past medical history significant for anxiety, asthma, and hyperlipidemia who presents to the ED due to worsening shortness of breath over the past few days.  Patient was diagnosed with COVID on 08/23/2020 and just recently started experiencing shortness of breath.  Denies associated chest pain and lower extremity edema.  Denies history of blood clots, recent surgeries, recent long immobilizations, hormonal treatments, and hemoptysis.  Patient reports to the ED requesting antibiotics and prednisone.  She states she has been taking doxycycline and prednisone for the past day with mild improvement in symptoms.  She continuously states this does not feel like an asthma exacerbation and denies associated wheeze.  Denies fever and chills.  Admits to intermittent dry cough. Her son was sick with similar symptoms. She is unvaccinated.  Denies abdominal pain, nausea, vomiting, diarrhea, urinary symptoms, headaches, dizziness.  History obtained from patient and past medical records. No interpreter used during encounter.      Past Medical History:  Diagnosis Date  . Ankle injury   . Anxiety   . Arthritis   . Asthma   . Back pain   . Headache   . Hyperlipidemia     Patient Active Problem List   Diagnosis Date Noted  . S/P vaginal hysterectomy 11/22/2016  . Pap smear abnormality of cervix with ASCUS favoring benign 04/14/2015  . Chronic RLQ pain 10/29/2013  . Chronic pelvic pain in female 09/16/2013    Past Surgical History:  Procedure Laterality Date  . ABDOMINAL HYSTERECTOMY    . CESAREAN SECTION    . CHOLECYSTECTOMY    . HEMORROIDECTOMY    . SALPINGOOPHORECTOMY Bilateral 11/22/2016   Procedure: BILATERAL SALPINGO OOPHORECTOMY;  Surgeon: Florian Buff, MD;  Location: [facility];  Service: Gynecology;  Laterality: Bilateral;  . SHOULDER SURGERY Right   . TONSILLECTOMY AND ADENOIDECTOMY  1988  . TONSILLECTOMY AND ADENOIDECTOMY    . TUBAL LIGATION    . VAGINAL HYSTERECTOMY N/A 11/22/2016   Procedure: HYSTERECTOMY VAGINAL;  Surgeon: Florian Buff, MD;  Location: [facility];  Service: Gynecology;  Laterality: N/A;     OB History    Gravida  6   Para  4   Term  4   Preterm      AB  2   Living  3     SAB  1   IAB      Ectopic      Multiple      Live Births  3           Family History  Problem Relation Age of Onset  . Hypertension Mother   . Cancer Mother        breast  . Stroke Mother   . Hyperlipidemia Mother   . Varicose Veins Mother   . Miscarriages / [place] Mother   . Brain cancer Mother   . Asthma Brother   . Hypothyroidism Son   . Diabetes Maternal Grandmother   . Heart disease Other     Social History   Tobacco Use  . Smoking status: Current Every Day Smoker    Packs/day: 1.00    Years: 20.00    Pack years: 20.00    Types: Cigarettes  . Smokeless tobacco: Never Used  Substance Use Topics  . Alcohol use: Yes    Comment: occ  . Drug use: No    Home Medications Prior to Admission medications   Medication Sig Start Date End Date Taking? Authorizing Provider  benzonatate (TESSALON) 100 MG capsule Take 1 capsule (100 mg total) by mouth every 8 (eight) hours. 09/06/20  Yes Jodene Polyak, Druscilla Brownie, PA-C  predniSONE (DELTASONE) 20 MG tablet Take 2 tablets (40 mg total) by mouth daily for 5 days. 09/06/20 09/11/20 Yes Telia Amundson, Druscilla Brownie, PA-C  albuterol (PROVENTIL HFA;VENTOLIN HFA) 108 (90 BASE) MCG/ACT inhaler Inhale 1-2 puffs into the lungs every 6 (six) hours as needed for wheezing or shortness of breath. 07/01/14   Nat Christen, MD  ALPRAZolam Duanne Moron) 1 MG tablet Take 1 mg by mouth 3 (three) times daily.    [provider]  azithromycin (ZITHROMAX) 250 MG tablet Take 1 tablet (250 mg total) by mouth daily. Take one  tablet daily for 4 days 10/24/19   Evalee Jefferson, PA-C  cyclobenzaprine (FLEXERIL) 10 MG tablet Take 1 tablet (10 mg total) by mouth 3 (three) times daily as needed for muscle spasms. 01/12/18   Veryl Speak, MD  fluticasone (FLONASE) 50 MCG/ACT nasal spray Place 2 sprays daily as needed into both nostrils for allergies or rhinitis.    [provider]  hydrOXYzine (VISTARIL) 50 MG capsule Take 50 mg by mouth as needed for anxiety (sleep).     [provider]  loratadine (CLARITIN) 10 MG tablet Take 10 mg daily by mouth.    [provider]  montelukast (SINGULAIR) 10 MG tablet Take 10 mg daily by mouth.    [provider]  naproxen (NAPROSYN) 500 MG tablet Take 1 tablet (500 mg total) by mouth 2 (two) times daily. 01/12/18   Veryl Speak, MD  oxybutynin (DITROPAN) 5 MG tablet Take 1 tablet (5 mg total) by mouth 3 (three) times daily. Patient not taking: Reported on 07/05/2017 03/06/17   Florian Buff, MD  simvastatin (ZOCOR) 40 MG tablet Take 40 mg by mouth every evening.     [provider]  theophylline (THEO-24) 300 MG 24 hr capsule Take 1 capsule (300 mg total) by mouth 3 (three) times daily. Patient taking differently: Take 300 mg by mouth as needed.  07/01/14   Nat Christen, MD    Allergies    Patient has no known allergies.  Review of Systems   Review of Systems  Constitutional: Negative for chills and fever.  Respiratory: Positive for cough and shortness of breath. Negative for wheezing.   Cardiovascular: Negative for chest pain and leg swelling.  Gastrointestinal: Negative for abdominal pain, diarrhea, nausea and vomiting.  All other systems reviewed and are negative.   Physical Exam Updated Vital Signs BP 126/76   Pulse (!) 109   Temp 98 F (36.7 C) (Oral)   Resp (!) 22   LMP  (LMP Unknown)   SpO2 98%   Physical Exam Vitals and nursing note reviewed.  Constitutional:      General: She is not in acute distress.    Appearance: She  is not ill-appearing.  HENT:     Head: Normocephalic.  Eyes:     Pupils: Pupils are equal, round, and reactive to light.  Cardiovascular:     Rate and Rhythm: Normal rate and regular rhythm.     Pulses: Normal pulses.     Heart sounds: Normal heart sounds. No murmur heard. No friction rub. No gallop.   Pulmonary:  Effort: Pulmonary effort is normal.     Breath sounds: Wheezing present. No rales.     Comments: Very mild late expiratory wheeze heard throughout. Chest:     Chest wall: No tenderness.  Abdominal:     General: Abdomen is flat. Bowel sounds are normal. There is no distension.     Palpations: Abdomen is soft.     Tenderness: There is no abdominal tenderness. There is no guarding or rebound.  Musculoskeletal:     Cervical back: Neck supple.     Comments: No lower extremity edema. Negative homan sign bilaterally.   Skin:    General: Skin is warm and dry.  Neurological:     General: No focal deficit present.     Mental Status: She is alert.  Psychiatric:        Mood and Affect: Mood normal.        Behavior: Behavior normal.     ED Results / Procedures / Treatments   Labs (all labs ordered are listed, but only abnormal results are displayed) Labs Reviewed - No data to display  EKG None  Radiology No results found.  Procedures Procedures (including critical care time)  Medications Ordered in ED Medications - No data to display  ED Course  I have reviewed the triage vital signs and the nursing notes.  Pertinent labs & imaging results that were available during my care of the patient were reviewed by me and considered in my medical decision making (see chart for details).    MDM Rules/Calculators/A&P                         50 year old female presents to the ED due to shortness of breath in the setting of COVID-19.  Patient diagnosed with COVID on 08/24/2019.  Has a history of asthma.  Denies associated chest pain and lower extremity edema.  Denies  history of blood clots, recent surgeries or recent long immobilizations, hormonal treatments, hemoptysis.  Upon arrival, stable vitals.  Triage noted patient to be tachycardic at 116 upon arrival. Vitals rechecked during initial evaluation and HR improved to 109. Patient very anxious and talking during vitals which I suspect is causing the tachycardia.  Patient in no acute distress and nontoxic-appearing.  Physical exam reassuring.  No lower extremity edema.  Negative Homans' sign bilaterally.  Very mild late expiratory wheeze.  Patient speaking in full sentences.  No accessory muscle usage.  Patient ambulated in the room and maintain O2 saturation at 99% the entire time without difficulty.  Suspect symptoms related to COVID infection with mild asthma exacerbation.  Low suspicion for bacterial pneumonia. Low suspicion for PE. No clinical signs of DVT on exam. patient discharged with prednsione and refill on albuterol. I had a long discussion with patient in regards to not needing antibiotics at this time due to [place] being a viral infection. Patient agreeable to plan.  Advised patient to follow-up with PCP if symptoms not improved within the next week. Strict ED precautions discussed with patient. Patient states understanding and agrees to plan. Patient discharged home in no acute distress and stable vitals.  Chloe Joyce was evaluated in Emergency Department on 09/06/2020 for the symptoms described in the history of present illness. She was evaluated in the context of the global COVID-19 pandemic, which necessitated consideration that the patient might be at risk for infection with the SARS-CoV-2 virus that causes COVID-19. Institutional protocols and algorithms that pertain to the evaluation of  patients at risk for COVID-19 are in a state of rapid change based on information released by regulatory bodies including the CDC and federal and state organizations. These policies and algorithms were followed during the  patient's care in the ED.  Final Clinical Impression(s) / ED Diagnoses Final diagnoses:  [id number] virus infection  Shortness of breath    Rx / DC Orders ED Discharge Orders         Ordered    predniSONE (DELTASONE) 20 MG tablet  Daily        09/06/20 1749    benzonatate (TESSALON) 100 MG capsule  Every 8 hours        09/06/20 Christmas, Eugenie Harewood C, PA-C 09/06/20 [place], Ankit, MD 09/06/20 [phone]

## 2020-09-06 NOTE — Discharge Instructions (Signed)
As discussed, I am sending you home with steroids and cough medication.  I suspect her symptoms are related to COVID and possibly a small asthma flare. Take medication as prescribed.  While you are in the ER your oxygen was normal which is reassuring. Please follow-up with PCP if symptoms do not improve within the next week.  Return to the ER for new or worsening symptoms.

## 2020-09-06 NOTE — ED Triage Notes (Signed)
States she wants a prescription for antibiotics and prednisone, diagnosed with covid on 08/23/20. States she has been on oxygen because her deceased father had oxygen and she still has the tank

## 2020-09-06 NOTE — ED Notes (Signed)
100% on RA while ambulating

## 2020-09-13 ENCOUNTER — Other Ambulatory Visit: Payer: Self-pay | Admitting: Emergency Medicine

## 2020-09-13 ENCOUNTER — Ambulatory Visit (HOSPITAL_COMMUNITY)
Admission: RE | Admit: 2020-09-13 | Discharge: 2020-09-13 | Disposition: A | Discharge status: Home / Self Care | Payer: Medicaid Other | Source: Ambulatory Visit | Attending: Emergency Medicine | Admitting: Emergency Medicine

## 2020-09-13 ENCOUNTER — Other Ambulatory Visit (HOSPITAL_COMMUNITY): Payer: Self-pay | Admitting: Emergency Medicine

## 2020-09-13 ENCOUNTER — Other Ambulatory Visit: Payer: Self-pay

## 2020-09-13 DIAGNOSIS — R0789 Other chest pain: Secondary | ICD-10-CM | POA: Insufficient documentation

## 2020-09-13 DIAGNOSIS — U071 COVID-19: Secondary | ICD-10-CM | POA: Diagnosis present

## 2020-09-13 MED ORDER — IOHEXOL 350 MG/ML SOLN
100.0000 mL | Freq: Once | INTRAVENOUS | Status: AC | PRN
  Administered 2020-09-13: 100 mL via INTRAVENOUS

## 2020-09-14 ENCOUNTER — Encounter (HOSPITAL_COMMUNITY): Payer: Self-pay

## 2020-09-14 ENCOUNTER — Ambulatory Visit (HOSPITAL_COMMUNITY): Payer: Medicaid Other

## 2021-05-04 ENCOUNTER — Other Ambulatory Visit: Payer: Self-pay

## 2021-05-04 ENCOUNTER — Emergency Department (HOSPITAL_COMMUNITY)
Admission: EM | Admit: 2021-05-04 | Discharge: 2021-05-04 | Disposition: A | Discharge status: Home / Self Care | Payer: Medicaid Other | Attending: Emergency Medicine | Admitting: Emergency Medicine

## 2021-05-04 ENCOUNTER — Encounter (HOSPITAL_COMMUNITY): Payer: Self-pay | Admitting: Emergency Medicine

## 2021-05-04 DIAGNOSIS — Z7951 Long term (current) use of inhaled steroids: Secondary | ICD-10-CM | POA: Insufficient documentation

## 2021-05-04 DIAGNOSIS — F17210 Nicotine dependence, cigarettes, uncomplicated: Secondary | ICD-10-CM | POA: Diagnosis not present

## 2021-05-04 DIAGNOSIS — J45909 Unspecified asthma, uncomplicated: Secondary | ICD-10-CM | POA: Insufficient documentation

## 2021-05-04 DIAGNOSIS — R21 Rash and other nonspecific skin eruption: Secondary | ICD-10-CM | POA: Diagnosis not present

## 2021-05-04 MED ORDER — ACYCLOVIR 800 MG PO TABS: 800.0000 mg | ORAL_TABLET | Freq: Every day | ORAL | 0 refills | Status: AC

## 2021-05-04 MED ORDER — TETRACAINE HCL 0.5 % OP SOLN
2.0000 [drp] | Freq: Once | OPHTHALMIC | Status: AC
  Administered 2021-05-04: 2 [drp] via OPHTHALMIC
  Filled 2021-05-04: qty 4

## 2021-05-04 MED ORDER — FLUORESCEIN SODIUM 1 MG OP STRP
1.0000 | ORAL_STRIP | Freq: Once | OPHTHALMIC | Status: AC
  Administered 2021-05-04: 1 via OPHTHALMIC

## 2021-05-04 MED ORDER — ACYCLOVIR 800 MG PO TABS
800.0000 mg | ORAL_TABLET | Freq: Once | ORAL | Status: AC
  Administered 2021-05-04: 800 mg via ORAL
  Filled 2021-05-04: qty 1

## 2021-05-04 NOTE — ED Triage Notes (Signed)
Pt c/o rash to face x one week.

## 2021-05-04 NOTE — Discharge Instructions (Signed)
The rash seems most consistent with shingles.   Take acyclovir as directed   Do not touch you eye and do not scratch the rash. If the rash spreads to your eye it can make you go blind.   You were given a referral to an eye doctor. You need to call the office tomorrow to schedule an appointment for follow up.  If you have worsening symptoms you need to return to the emergency department

## 2021-05-04 NOTE — ED Provider Notes (Signed)
[facility] EMERGENCY DEPARTMENT Provider Note   CSN: [id number] Arrival date & time: 05/04/21  2026     History Chief Complaint  Patient presents with   Rash    Chloe Joyce is a 50 y.o. female.  HPI   50 y/o female presents for eval of rash to the face for about 9 days. She reports the rash it itchy and started on her nose. It has since spread between her eyes and to the left eyebrow. She denies vision changes or eye pain. There are no other reported sxs.  Past Medical History:  Diagnosis Date   Ankle injury    Anxiety    Arthritis    Asthma    Back pain    Headache    Hyperlipidemia     Patient Active Problem List   Diagnosis Date Noted   S/P vaginal hysterectomy 11/22/2016   Pap smear abnormality of cervix with ASCUS favoring benign 04/14/2015   Chronic RLQ pain 10/29/2013   Chronic pelvic pain in female 09/16/2013    Past Surgical History:  Procedure Laterality Date   ABDOMINAL HYSTERECTOMY     CESAREAN SECTION     CHOLECYSTECTOMY     HEMORROIDECTOMY     SALPINGOOPHORECTOMY Bilateral 11/22/2016   Procedure: BILATERAL SALPINGO OOPHORECTOMY;  Surgeon: Florian Buff, MD;  Location: [facility];  Service: Gynecology;  Laterality: Bilateral;   SHOULDER SURGERY Right    TONSILLECTOMY AND ADENOIDECTOMY  1988   TONSILLECTOMY AND ADENOIDECTOMY     TUBAL LIGATION     VAGINAL HYSTERECTOMY N/A 11/22/2016   Procedure: HYSTERECTOMY VAGINAL;  Surgeon: Florian Buff, MD;  Location: [facility];  Service: Gynecology;  Laterality: N/A;     OB History     Gravida  6   Para  4   Term  4   Preterm      AB  2   Living  3      SAB  1   IAB      Ectopic      Multiple      Live Births  3           Family History  Problem Relation Age of Onset   Hypertension Mother    Cancer Mother        breast   Stroke Mother    Hyperlipidemia Mother    Varicose Veins Mother    Miscarriages / [place] Mother    Brain cancer Mother    Asthma Brother    Hypothyroidism  Son    Diabetes Maternal Grandmother    Heart disease Other     Social History   Tobacco Use   Smoking status: Every Day    Packs/day: 1.00    Years: 20.00    Pack years: 20.00    Types: Cigarettes   Smokeless tobacco: Never  Substance Use Topics   Alcohol use: Yes    Comment: occ   Drug use: No    Home Medications Prior to Admission medications   Medication Sig Start Date End Date Taking? Authorizing Provider  acyclovir (ZOVIRAX) 800 MG tablet Take 1 tablet (800 mg total) by mouth 5 (five) times daily for 7 days. 05/04/21 05/11/21 Yes Kiasha Bellin S, PA-C  albuterol (PROVENTIL HFA;VENTOLIN HFA) 108 (90 BASE) MCG/ACT inhaler Inhale 1-2 puffs into the lungs every 6 (six) hours as needed for wheezing or shortness of breath. 07/01/14   Nat Christen, MD  ALPRAZolam Duanne Moron) 1 MG tablet Take 1 mg by  mouth 3 (three) times daily.    [provider]  azithromycin (ZITHROMAX) 250 MG tablet Take 1 tablet (250 mg total) by mouth daily. Take one tablet daily for 4 days 10/24/19   Evalee Jefferson, PA-C  benzonatate (TESSALON) 100 MG capsule Take 1 capsule (100 mg total) by mouth every 8 (eight) hours. 09/06/20   Suzy Bouchard, PA-C  cyclobenzaprine (FLEXERIL) 10 MG tablet Take 1 tablet (10 mg total) by mouth 3 (three) times daily as needed for muscle spasms. 01/12/18   Veryl Speak, MD  fluticasone (FLONASE) 50 MCG/ACT nasal spray Place 2 sprays daily as needed into both nostrils for allergies or rhinitis.    [provider]  hydrOXYzine (VISTARIL) 50 MG capsule Take 50 mg by mouth as needed for anxiety (sleep).     [provider]  loratadine (CLARITIN) 10 MG tablet Take 10 mg daily by mouth.    [provider]  montelukast (SINGULAIR) 10 MG tablet Take 10 mg daily by mouth.    [provider]  naproxen (NAPROSYN) 500 MG tablet Take 1 tablet (500 mg total) by mouth 2 (two) times daily. 01/12/18   Veryl Speak, MD  oxybutynin (DITROPAN) 5 MG tablet Take  1 tablet (5 mg total) by mouth 3 (three) times daily. Patient not taking: Reported on 07/05/2017 03/06/17   Florian Buff, MD  simvastatin (ZOCOR) 40 MG tablet Take 40 mg by mouth every evening.     [provider]  theophylline (THEO-24) 300 MG 24 hr capsule Take 1 capsule (300 mg total) by mouth 3 (three) times daily. Patient taking differently: Take 300 mg by mouth as needed.  07/01/14   Nat Christen, MD    Allergies    Patient has no known allergies.  Review of Systems   Review of Systems  Constitutional:  Negative for fever.  Eyes:  Negative for photophobia, pain, discharge, redness, itching and visual disturbance.  Skin:  Positive for rash.   Physical Exam Updated Vital Signs BP 125/83 (BP Location: Right Arm)   Pulse 97   Temp 97.7 F (36.5 C) (Oral)   Resp 20   Ht '5\' 6"'$  (1.676 m)   Wt 81.6 kg   LMP  (LMP Unknown)   SpO2 99%   BMI 29.05 kg/m   Physical Exam Vitals and nursing note reviewed.  Constitutional:      General: She is not in acute distress.    Appearance: She is well-developed.  HENT:     Head: Normocephalic and atraumatic.     Left Ear: Tympanic membrane normal.  Eyes:     Extraocular Movements: Extraocular movements intact.     Conjunctiva/sclera: Conjunctivae normal.     Pupils: Pupils are equal, round, and reactive to light.  Cardiovascular:     Rate and Rhythm: Normal rate.  Pulmonary:     Effort: Pulmonary effort is normal.  Musculoskeletal:        General: Normal range of motion.     Cervical back: Neck supple.  Skin:    General: Skin is warm and dry.  Neurological:     Mental Status: She is alert.       ED Results / Procedures / Treatments   Labs (all labs ordered are listed, but only abnormal results are displayed) Labs Reviewed - No data to display  EKG None  Radiology No results found.  Procedures Procedures    Visual Acuity  Right Eye Distance: 20/30 Left Eye Distance: 20/30 Bilateral  Distance:  20/30  Right Eye Near:   Left Eye Near:    Bilateral Near:      Medications Ordered in ED Medications  fluorescein ophthalmic strip 1 strip (1 strip Left Eye Given 05/04/21 2104)  tetracaine (PONTOCAINE) 0.5 % ophthalmic solution 2 drop (2 drops Left Eye Given 05/04/21 2104)    ED Course  I have reviewed the triage vital signs and the nursing notes.  Pertinent labs & imaging results that were available during my care of the patient were reviewed by me and considered in my medical decision making (see chart for details).    MDM Rules/Calculators/A&P                          50 y/o f here with rash to face. Rash is concerning for zoster.  Fluorescein eye exam completed and there is no evidence of dendritic stranding and visual acuity is wnl. Low suspicion for zoster ophthalmicus however given proximity of rash to the eye we will refer to ophtho. Tms clear and no evidence of ramsey hunt syndrome. Pt to be started on acyclovir. Have advised close f/u and strict return precautions. She voices understanding of the plan and reasons to return. All questions answered, pt stable for discharge.    Final Clinical Impression(s) / ED Diagnoses Final diagnoses:  Rash    Rx / DC Orders ED Discharge Orders          Ordered    acyclovir (ZOVIRAX) 800 MG tablet  5 times daily        05/04/21 2100             Bishop Dublin 05/04/21 2114    Luna Fuse, MD 05/25/21 1623

## 2021-05-16 ENCOUNTER — Encounter: Payer: Self-pay | Admitting: Radiology

## 2021-05-22 ENCOUNTER — Other Ambulatory Visit: Payer: Self-pay

## 2021-05-22 ENCOUNTER — Encounter (HOSPITAL_COMMUNITY): Payer: Self-pay | Admitting: Emergency Medicine

## 2021-05-22 ENCOUNTER — Emergency Department (HOSPITAL_COMMUNITY)
Admission: EM | Admit: 2021-05-22 | Discharge: 2021-05-22 | Disposition: A | Discharge status: Home / Self Care | Payer: Medicaid Other | Attending: Emergency Medicine | Admitting: Emergency Medicine

## 2021-05-22 DIAGNOSIS — F17210 Nicotine dependence, cigarettes, uncomplicated: Secondary | ICD-10-CM | POA: Insufficient documentation

## 2021-05-22 DIAGNOSIS — H9202 Otalgia, left ear: Secondary | ICD-10-CM | POA: Insufficient documentation

## 2021-05-22 DIAGNOSIS — B028 Zoster with other complications: Secondary | ICD-10-CM | POA: Insufficient documentation

## 2021-05-22 DIAGNOSIS — J45909 Unspecified asthma, uncomplicated: Secondary | ICD-10-CM | POA: Diagnosis not present

## 2021-05-22 MED ORDER — ACYCLOVIR 800 MG PO TABS: 800.0000 mg | ORAL_TABLET | Freq: Every day | ORAL | 0 refills | Status: DC

## 2021-05-22 MED ORDER — CIPROFLOXACIN-HYDROCORTISONE 0.2-1 % OT SUSP: 3.0000 [drp] | Freq: Two times a day (BID) | OTIC | 0 refills | Status: DC

## 2021-05-22 MED ORDER — ACYCLOVIR 800 MG PO TABS
800.0000 mg | ORAL_TABLET | Freq: Once | ORAL | Status: AC
  Administered 2021-05-22: 800 mg via ORAL
  Filled 2021-05-22: qty 1

## 2021-05-22 MED ORDER — ACYCLOVIR 400 MG PO TABS: 400.0000 mg | ORAL_TABLET | Freq: Four times a day (QID) | ORAL | 0 refills | Status: DC

## 2021-05-22 NOTE — Discharge Instructions (Addendum)
Take the medication as directed.  Use the drops the left ear for 7 days.  Follow up with your doctor for recheck

## 2021-05-22 NOTE — ED Provider Notes (Signed)
[facility] [place] EMERGENCY DEPARTMENT Provider Note   CSN: [id number] Arrival date & time: 05/22/21  1447     History Chief Complaint  Patient presents with   Otalgia    Chloe Joyce is a 50 y.o. female.   Otalgia Associated symptoms: rash   Associated symptoms: no congestion, no fever, no headaches and no neck pain        Chloe Joyce is a 50 y.o. female who presents to the Emergency Department complaining of itching and fullness of the left ear.  She was seen here 3 weeks ago left-sided facial zoster.  She completed a 7-day course of acyclovir.  She states that the lesions of her face have healed, but she now has itching and sensation of fullness to her left ear.  She states the outer rim of her ear is crusting and she has itching down inside her ear.  She is using calamine lotion and over-the-counter Benadryl without relief.  The itching is keeping her from sleeping at night.  She followed up with ophthalmology and denies any symptoms of her eye, headache or facial droop.  No fever or chills.   Past Medical History:  Diagnosis Date   Ankle injury    Anxiety    Arthritis    Asthma    Back pain    Headache    Hyperlipidemia     Patient Active Problem List   Diagnosis Date Noted   S/P vaginal hysterectomy 11/22/2016   Pap smear abnormality of cervix with ASCUS favoring benign 04/14/2015   Chronic RLQ pain 10/29/2013   Chronic pelvic pain in female 09/16/2013    Past Surgical History:  Procedure Laterality Date   ABDOMINAL HYSTERECTOMY     CESAREAN SECTION     CHOLECYSTECTOMY     HEMORROIDECTOMY     SALPINGOOPHORECTOMY Bilateral 11/22/2016   Procedure: BILATERAL SALPINGO OOPHORECTOMY;  Surgeon: Florian Buff, MD;  Location: [facility];  Service: Gynecology;  Laterality: Bilateral;   SHOULDER SURGERY Right    TONSILLECTOMY AND ADENOIDECTOMY  1988   TONSILLECTOMY AND ADENOIDECTOMY     TUBAL LIGATION     VAGINAL HYSTERECTOMY N/A 11/22/2016   Procedure: HYSTERECTOMY  VAGINAL;  Surgeon: Florian Buff, MD;  Location: [facility];  Service: Gynecology;  Laterality: N/A;     OB History     Gravida  6   Para  4   Term  4   Preterm      AB  2   Living  3      SAB  1   IAB      Ectopic      Multiple      Live Births  3           Family History  Problem Relation Age of Onset   Hypertension Mother    Cancer Mother        breast   Stroke Mother    Hyperlipidemia Mother    Varicose Veins Mother    Miscarriages / [place] Mother    Brain cancer Mother    Asthma Brother    Hypothyroidism Son    Diabetes Maternal Grandmother    Heart disease Other     Social History   Tobacco Use   Smoking status: Every Day    Packs/day: 1.00    Years: 20.00    Pack years: 20.00    Types: Cigarettes   Smokeless tobacco: Never  Substance Use Topics   Alcohol use: Yes  Comment: occ   Drug use: No    Home Medications Prior to Admission medications   Medication Sig Start Date End Date Taking? Authorizing Provider  albuterol (PROVENTIL HFA;VENTOLIN HFA) 108 (90 BASE) MCG/ACT inhaler Inhale 1-2 puffs into the lungs every 6 (six) hours as needed for wheezing or shortness of breath. 07/01/14   Nat Christen, MD  ALPRAZolam Duanne Moron) 1 MG tablet Take 1 mg by mouth 3 (three) times daily.    [provider]  azithromycin (ZITHROMAX) 250 MG tablet Take 1 tablet (250 mg total) by mouth daily. Take one tablet daily for 4 days 10/24/19   Evalee Jefferson, PA-C  benzonatate (TESSALON) 100 MG capsule Take 1 capsule (100 mg total) by mouth every 8 (eight) hours. 09/06/20   Suzy Bouchard, PA-C  cyclobenzaprine (FLEXERIL) 10 MG tablet Take 1 tablet (10 mg total) by mouth 3 (three) times daily as needed for muscle spasms. 01/12/18   Veryl Speak, MD  fluticasone (FLONASE) 50 MCG/ACT nasal spray Place 2 sprays daily as needed into both nostrils for allergies or rhinitis.    [provider]  hydrOXYzine (VISTARIL) 50 MG capsule Take 50 mg by mouth  as needed for anxiety (sleep).     [provider]  loratadine (CLARITIN) 10 MG tablet Take 10 mg daily by mouth.    [provider]  montelukast (SINGULAIR) 10 MG tablet Take 10 mg daily by mouth.    [provider]  naproxen (NAPROSYN) 500 MG tablet Take 1 tablet (500 mg total) by mouth 2 (two) times daily. 01/12/18   Veryl Speak, MD  oxybutynin (DITROPAN) 5 MG tablet Take 1 tablet (5 mg total) by mouth 3 (three) times daily. Patient not taking: Reported on 07/05/2017 03/06/17   Florian Buff, MD  simvastatin (ZOCOR) 40 MG tablet Take 40 mg by mouth every evening.     [provider]  theophylline (THEO-24) 300 MG 24 hr capsule Take 1 capsule (300 mg total) by mouth 3 (three) times daily. Patient taking differently: Take 300 mg by mouth as needed.  07/01/14   Nat Christen, MD    Allergies    Patient has no known allergies.  Review of Systems   Review of Systems  Constitutional:  Negative for appetite change and fever.  HENT:  Positive for ear pain. Negative for congestion and trouble swallowing.   Respiratory:  Negative for shortness of breath.   Cardiovascular:  Negative for chest pain.  Musculoskeletal:  Negative for arthralgias, myalgias and neck pain.  Skin:  Positive for color change and rash.  Neurological:  Negative for dizziness, syncope, weakness, numbness and headaches.  Psychiatric/Behavioral:  Negative for confusion.    Physical Exam Updated Vital Signs BP (!) 124/55 (BP Location: Right Arm)   Pulse 96   Temp 98.3 F (36.8 C) (Oral)   Resp 16   Ht 5\' 6"  (1.676 m)   Wt 81.6 kg   LMP  (LMP Unknown)   SpO2 97%   BMI 29.05 kg/m   Physical Exam Vitals and nursing note reviewed.  Constitutional:      General: She is not in acute distress.    Appearance: Normal appearance.  HENT:     Head:     Comments: No facial lesions noted    Right Ear: Tympanic membrane and ear canal normal.     Ears:     Comments: Erythema and crusting  of the external left ear.  Crusting extends into the left ear canal.  No edema or drainage.  No erythema or bulging of the TM.  See attached photo    Mouth/Throat:     Mouth: Mucous membranes are moist.     Pharynx: Oropharynx is clear.  Eyes:     Extraocular Movements: Extraocular movements intact.     Conjunctiva/sclera: Conjunctivae normal.     Pupils: Pupils are equal, round, and reactive to light.     Comments: No edema, lesions or erythema of the eyelids.  Cardiovascular:     Rate and Rhythm: Normal rate and regular rhythm.     Pulses: Normal pulses.  Pulmonary:     Effort: Pulmonary effort is normal. No respiratory distress.  Musculoskeletal:        General: Normal range of motion.     Cervical back: Normal range of motion. No rigidity.  Lymphadenopathy:     Cervical: No cervical adenopathy.  Skin:    General: Skin is warm.     Capillary Refill: Capillary refill takes less than 2 seconds.  Neurological:     General: No focal deficit present.     Mental Status: She is alert.     Sensory: No sensory deficit.     Motor: No weakness.     Comments: CN II-XII intact.  Speech clear.  No facial droop       Pt gave verbal consent for image to be stored in medical record.    ED Results / Procedures / Treatments   Labs (all labs ordered are listed, but only abnormal results are displayed) Labs Reviewed - No data to display  EKG None  Radiology No results found.  Procedures Procedures   Medications Ordered in ED Medications - No data to display  ED Course  I have reviewed the triage vital signs and the nursing notes.  Pertinent labs & imaging results that were available during my care of the patient were reviewed by me and considered in my medical decision making (see chart for details).    MDM Rules/Calculators/A&P                           Pt seen here 05/04/21 for facial rash and treated for zoster. Returns for left ear pain and pruritic rash.  No facial  droop, headache or rash of the scalp.  Reported facial rash from prior visit appears well improved.   Will treat with additional course of antiviral and topical ear drops.  She agrees to OTC benadryl for itching.  She appears appropriate for d/c home.  All questions answered.   Final Clinical Impression(s) / ED Diagnoses Final diagnoses:  Herpes zoster with other complication  Otalgia of left ear    Rx / DC Orders ED Discharge Orders     None        Bufford Lope 05/24/21 2328    Noemi Chapel, MD 05/25/21 1209

## 2021-05-22 NOTE — ED Triage Notes (Signed)
Pt reports shingles 3 weeks ago. Pt reports that "it is in my left ear." I can feel fullness and I can't sleep."

## 2021-05-25 ENCOUNTER — Ambulatory Visit: Payer: Medicaid Other | Admitting: Orthopedic Surgery

## 2021-06-01 ENCOUNTER — Ambulatory Visit (INDEPENDENT_AMBULATORY_CARE_PROVIDER_SITE_OTHER): Payer: Medicaid Other | Admitting: Orthopedic Surgery

## 2021-06-01 ENCOUNTER — Encounter: Payer: Self-pay | Admitting: Orthopedic Surgery

## 2021-06-01 ENCOUNTER — Other Ambulatory Visit: Payer: Self-pay

## 2021-06-01 ENCOUNTER — Ambulatory Visit: Payer: Medicaid Other

## 2021-06-01 VITALS — BP 117/80 | HR 99 | Ht 66.0 in | Wt 182.0 lb

## 2021-06-01 DIAGNOSIS — G8929 Other chronic pain: Secondary | ICD-10-CM

## 2021-06-01 DIAGNOSIS — M25511 Pain in right shoulder: Secondary | ICD-10-CM

## 2021-06-01 DIAGNOSIS — M19011 Primary osteoarthritis, right shoulder: Secondary | ICD-10-CM

## 2021-06-01 NOTE — Progress Notes (Signed)
New Patient Visit  Assessment: Chloe Joyce is a 50 y.o. female with the following: 1. Arthritis of right glenohumeral joint  Plan: Patient has severe pain, and range of motion of the right shoulder.  She has a history of right shoulder surgery for instability approximately 30 years ago.  She has since then post traumatic glenohumeral arthritis, with bone-on-bone articulation.  Medications have not improved her symptoms.  We discussed multiple treatment options including a subacromial injection versus a image guided glenohumeral joint injection, and she like to proceed with a subacromial injection in clinic today.  Depending on the efficacy of this injection, we may have to consider a targeted glenohumeral joint injection.  The severity of her arthritis may ultimately result in her pursuing a right shoulder replacement.  Procedure note injection - Right shoulder    Verbal consent was obtained to inject the right shoulder, subacromial space Timeout was completed to confirm the site of injection.   The skin was prepped with alcohol and ethyl chloride was sprayed at the injection site.  A 21-gauge needle was used to inject 40 mg of Depo-Medrol and 1% lidocaine (3 cc) into the subacromial space of the right shoulder using a posterolateral approach.  There were no complications.  A sterile bandage was applied.    Follow-up: Return if symptoms worsen or fail to improve.  Subjective:  Chief Complaint  Patient presents with   Shoulder Pain    Rt shoulder pain for yrs getting worse, loss of ROM. PT states she has had reconstructive sx 30 yrs ago.     History of Present Illness: Chloe Joyce is a 50 y.o. female who has been referred to clinic today by Vidal Schwalbe, MD For evaluation of right shoulder pain.  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  She has done well following that surgery.  However, she has had progressively worsening  pain in her right shoulder for several years.  She has limited range of motion.  She does notice some crepitus and clicking with movement.  Due to the pain, and difficulty using her right arm, she is unable to work.  She is been taking medications, including tramadol with limited relief in her symptoms.  She has not had an injection in her right shoulder.  No recent physical therapy.  Review of Systems: No fevers or chills No numbness or tingling No chest pain No shortness of breath No bowel or bladder dysfunction No GI distress No headaches   Medical History:  Past Medical History:  Diagnosis Date   Ankle injury    Anxiety    Arthritis    Asthma    Back pain    Headache    Hyperlipidemia     Past Surgical History:  Procedure Laterality Date   ABDOMINAL HYSTERECTOMY     CESAREAN SECTION     CHOLECYSTECTOMY     HEMORROIDECTOMY     SALPINGOOPHORECTOMY Bilateral 11/22/2016   Procedure: BILATERAL SALPINGO OOPHORECTOMY;  Surgeon: Florian Buff, MD;  Location: [facility];  Service: Gynecology;  Laterality: Bilateral;   SHOULDER SURGERY Right    TONSILLECTOMY AND ADENOIDECTOMY  1988   TONSILLECTOMY AND ADENOIDECTOMY     TUBAL LIGATION     VAGINAL HYSTERECTOMY N/A 11/22/2016   Procedure: HYSTERECTOMY VAGINAL;  Surgeon: Florian Buff, MD;  Location: [facility];  Service: Gynecology;  Laterality: N/A;    Family History  Problem Relation Age of Onset   Hypertension  Mother    Cancer Mother        breast   Stroke Mother    Hyperlipidemia Mother    Varicose Veins Mother    Miscarriages / [place] Mother    Brain cancer Mother    Asthma Brother    Hypothyroidism Son    Diabetes Maternal Grandmother    Heart disease Other    Social History   Tobacco Use   Smoking status: Every Day    Packs/day: 1.00    Years: 20.00    Pack years: 20.00    Types: Cigarettes   Smokeless tobacco: Never  Substance Use Topics   Alcohol use: Yes    Comment: occ   Drug use: No    No Known  Allergies  No outpatient medications have been marked as taking for the 06/01/21 encounter (Office Visit) with Mordecai Rasmussen, MD.    Objective: BP 117/80   Pulse 99   Ht 5\' 6"  (1.676 m)   Wt 182 lb (82.6 kg)   LMP  (LMP Unknown)   BMI 29.38 kg/m   Physical Exam:  General: Alert and oriented. and No acute distress. Gait: Normal gait.  Evaluation of right shoulder demonstrates no deformity.  Crepitus with range of motion testing.  She has difficulty getting her arm above her head.  Severe pain with forward flexion beyond 90 degrees.  Limited external rotation at her side.  Pain in the empty can testing position.  Fingers are warm and well-perfused.  Sensation is intact throughout the arm.  IMAGING: I personally ordered and reviewed the following images  X-rays of the right shoulder obtained in clinic today and demonstrates no acute injuries.  She has complete loss of joint space within the right shoulder.  There are inferior osteophytes.  There are some loose bodies within the superior aspect of the glenohumeral joint, as well as within the axillary pouch.  There is subchondral sclerosis, without flattening of the humeral head.  Impression: Severe right glenohumeral arthritis.   New Medications:  No orders of the defined types were placed in this encounter.     Mordecai Rasmussen, MD  06/02/2021 9:45 AM

## 2021-06-01 NOTE — Patient Instructions (Signed)

## 2021-06-02 ENCOUNTER — Encounter: Payer: Self-pay | Admitting: Orthopedic Surgery

## 2021-08-02 ENCOUNTER — Ambulatory Visit (INDEPENDENT_AMBULATORY_CARE_PROVIDER_SITE_OTHER): Payer: Medicaid Other | Admitting: Orthopedic Surgery

## 2021-08-02 ENCOUNTER — Other Ambulatory Visit: Payer: Self-pay

## 2021-08-02 ENCOUNTER — Encounter: Payer: Self-pay | Admitting: Orthopedic Surgery

## 2021-08-02 VITALS — BP 121/81 | HR 91 | Ht 66.0 in | Wt 189.0 lb

## 2021-08-02 DIAGNOSIS — M19011 Primary osteoarthritis, right shoulder: Secondary | ICD-10-CM

## 2021-08-02 NOTE — Patient Instructions (Signed)
Obtain medical clearance for shoulder surgery  Once medical clearance, obtain a CT scan  Then we can finalize a date

## 2021-08-02 NOTE — Progress Notes (Signed)
Orthopaedic Clinic Return  Assessment: Chloe Joyce is a 50 y.o. female with the following: Severe right glenohumeral arthritis; in setting of remote right shoulder reconstruction  Plan: Reviewed radiographs from previous visit with the patient.  She has severe right glenohumeral arthritis.  Subacromial steroid injection did not provide sustained relief.  She continues to have significant pain, and severe limitations in her motion.  We discussed right total shoulder arthroplasty in great detail.  All questions were answered.  I did offer her surgery, if she is interested.  She is to obtain medical clearance.  Once this is completed, we can schedule a CT scan, if she wishes to proceed with surgery.  All questions were answered, regarding the surgery and postop recovery.  We discussed smoking, and I have advised her to limit the amount of smoking, as this and can increase her risk for complications following surgery.  She stated her understanding.  No follow-up is needed at this time.  We will wait to hear from her regarding her decision on surgery.  Follow-up: Return for After medical clearance for OR.   Subjective:  Chief Complaint  Patient presents with   Shoulder Pain    Rt shoulder pain getting worse.     History of Present Illness: Chloe Joyce is a 50 y.o. female who returns to clinic for repeat evaluation of right shoulder pain.  She was seen in clinic a couple months ago, at that time she had a subacromial steroid injection.  She stated this helped with some of her pain initially, but it progressively worsened.  At this time, her pain is worse.  She is taken muscle relaxers, as well as tramadol, with limited improvement in her pain.  The pain is severely limiting.  It makes it hard for her to sleep at night.  She is unable to work due to the pain.  Review of Systems: No fevers or chills No numbness or tingling No chest pain No shortness of breath No bowel or bladder  dysfunction No GI distress No headaches   Objective: BP 121/81    Pulse 91    Ht 5\' 6"  (1.676 m)    Wt 189 lb (85.7 kg)    LMP  (LMP Unknown)    BMI 30.51 kg/m   Physical Exam:  Alert and oriented.  No acute distress.  Evaluation the right shoulder demonstrates a well-healed anterior based surgical incision.  No surrounding erythema or drainage.  Forward flexion is limited to 100 degrees.  Restricted external rotation at her side.  Crepitus is appreciated with range of motion.  Fingers are warm and well-perfused.  Sensations intact over the axillary nerve distribution.  Sensation is intact to the right hand.  IMAGING: I personally ordered and reviewed the following images:   Mordecai Rasmussen, MD 08/02/2021 12:09 PM

## 2021-09-06 ENCOUNTER — Telehealth: Payer: Self-pay

## 2021-09-06 NOTE — Telephone Encounter (Signed)
I called patient, she says she has seen Dr Bartolo Darter for surg clearance.  I faxed clearance request to Dr Arliss Journey office.  They have not sent anything to [place] yet.  Need to call patient and advise once that is received, and then we can order/precert the CT scan and look at scheduling surgery.  She understands.

## 2021-09-06 NOTE — Telephone Encounter (Signed)
Pt called into the office and would like to know if she can have a CT scan done and she would also like to know if she can get her surgery scheduled.

## 2021-09-14 ENCOUNTER — Other Ambulatory Visit: Payer: Self-pay

## 2021-09-14 ENCOUNTER — Emergency Department (HOSPITAL_COMMUNITY)
Admission: EM | Admit: 2021-09-14 | Discharge: 2021-09-15 | Disposition: A | Discharge status: Home / Self Care | Payer: Medicaid Other | Attending: Emergency Medicine | Admitting: Emergency Medicine

## 2021-09-14 ENCOUNTER — Encounter (HOSPITAL_COMMUNITY): Payer: Self-pay | Admitting: Emergency Medicine

## 2021-09-14 DIAGNOSIS — Z20822 Contact with and (suspected) exposure to covid-19: Secondary | ICD-10-CM | POA: Insufficient documentation

## 2021-09-14 DIAGNOSIS — Z7951 Long term (current) use of inhaled steroids: Secondary | ICD-10-CM | POA: Insufficient documentation

## 2021-09-14 DIAGNOSIS — J4541 Moderate persistent asthma with (acute) exacerbation: Secondary | ICD-10-CM | POA: Diagnosis not present

## 2021-09-14 DIAGNOSIS — F17200 Nicotine dependence, unspecified, uncomplicated: Secondary | ICD-10-CM | POA: Insufficient documentation

## 2021-09-14 DIAGNOSIS — R0602 Shortness of breath: Secondary | ICD-10-CM | POA: Diagnosis present

## 2021-09-14 NOTE — Telephone Encounter (Signed)
LVM for patient to call back. ?

## 2021-09-14 NOTE — ED Triage Notes (Signed)
Pt c/o sob and wheezing for a while.

## 2021-09-15 ENCOUNTER — Encounter (HOSPITAL_COMMUNITY): Payer: Self-pay | Admitting: Emergency Medicine

## 2021-09-15 ENCOUNTER — Emergency Department (HOSPITAL_COMMUNITY): Payer: Medicaid Other

## 2021-09-15 LAB — CBC WITH DIFFERENTIAL/PLATELET
Abs Immature Granulocytes: 0.04 10*3/uL (ref 0.00–0.07)
Basophils Absolute: 0.1 10*3/uL (ref 0.0–0.1)
Basophils Relative: 1 %
Eosinophils Absolute: 0.4 10*3/uL (ref 0.0–0.5)
Eosinophils Relative: 5 %
HCT: 41.3 % (ref 36.0–46.0)
Hemoglobin: 13.7 g/dL (ref 12.0–15.0)
Immature Granulocytes: 1 %
Lymphocytes Relative: 39 %
Lymphs Abs: 3.4 10*3/uL (ref 0.7–4.0)
MCH: 30.6 pg (ref 26.0–34.0)
MCHC: 33.2 g/dL (ref 30.0–36.0)
MCV: 92.4 fL (ref 80.0–100.0)
Monocytes Absolute: 0.8 10*3/uL (ref 0.1–1.0)
Monocytes Relative: 9 %
Neutro Abs: 4.1 10*3/uL (ref 1.7–7.7)
Neutrophils Relative %: 45 %
Platelets: 339 10*3/uL (ref 150–400)
RBC: 4.47 MIL/uL (ref 3.87–5.11)
RDW: 12.6 % (ref 11.5–15.5)
WBC: 8.8 10*3/uL (ref 4.0–10.5)
nRBC: 0 % (ref 0.0–0.2)

## 2021-09-15 LAB — BASIC METABOLIC PANEL
Anion gap: 8 (ref 5–15)
BUN: 11 mg/dL (ref 6–20)
CO2: 26 mmol/L (ref 22–32)
Calcium: 9.4 mg/dL (ref 8.9–10.3)
Chloride: 102 mmol/L (ref 98–111)
Creatinine, Ser: 0.77 mg/dL (ref 0.44–1.00)
GFR, Estimated: >60 mL/min (ref >60)
Glucose, Bld: 109 mg/dL — ABNORMAL HIGH (ref 70–99)
Potassium: 3.4 mmol/L — ABNORMAL LOW (ref 3.5–5.1)
Sodium: 136 mmol/L (ref 135–145)

## 2021-09-15 LAB — RESP PANEL BY RT-PCR (FLU A&B, COVID) ARPGX2
Influenza A by PCR: NEGATIVE
Influenza B by PCR: NEGATIVE
SARS Coronavirus 2 by RT PCR: NEGATIVE

## 2021-09-15 MED ORDER — IPRATROPIUM-ALBUTEROL 0.5-2.5 (3) MG/3ML IN SOLN
3.0000 mL | Freq: Once | RESPIRATORY_TRACT | Status: AC
  Administered 2021-09-15: 3 mL via RESPIRATORY_TRACT
  Filled 2021-09-15: qty 3

## 2021-09-15 MED ORDER — DEXAMETHASONE SODIUM PHOSPHATE 10 MG/ML IJ SOLN
10.0000 mg | Freq: Once | INTRAMUSCULAR | Status: AC
  Administered 2021-09-15: 10 mg via INTRAMUSCULAR
  Filled 2021-09-15: qty 1

## 2021-09-15 MED ORDER — DEXAMETHASONE SODIUM PHOSPHATE 10 MG/ML IJ SOLN: 10.0000 mg | Freq: Once | INTRAMUSCULAR | Status: DC

## 2021-09-15 MED ORDER — DEXAMETHASONE 6 MG PO TABS: 12.0000 mg | ORAL_TABLET | Freq: Once | ORAL | 0 refills | Status: AC

## 2021-09-15 MED ORDER — ALBUTEROL SULFATE HFA 108 (90 BASE) MCG/ACT IN AERS
2.0000 | INHALATION_SPRAY | Freq: Once | RESPIRATORY_TRACT | Status: DC
  Filled 2021-09-15: qty 6.7

## 2021-09-15 NOTE — ED Provider Notes (Signed)
[facility] EMERGENCY DEPARTMENT Provider Note   CSN: [id number] Arrival date & time: 09/14/21  2313     History  Chief Complaint  Patient presents with   Shortness of Breath    Chloe Joyce is a 51 y.o. female.  HPI  This is a 51 year old female with a history of asthma and smoking who presents with shortness of breath and cough.  Patient reports that she has had several week history of worsening wheezing.  Particularly worse over the last week.  She has gone through 2 inhalers.  She has not had any recent admissions for asthma.  She continues to smoke.  She states that she had influenza in November and since that time has not been great.  She has had some cough but no productivity of cough.  No fevers.  She has been taking Theophylline with no relief.  Denies chest pressure or pain.  Denies extremity swelling or edema.  Home Medications Prior to Admission medications   Medication Sig Start Date End Date Taking? Authorizing Provider  dexamethasone (DECADRON) 6 MG tablet Take 2 tablets (12 mg total) by mouth once for 1 dose. If not improving 09/17/21 09/17/21 Yes Virgilio Broadhead, Barbette Hair, MD  albuterol (PROVENTIL HFA;VENTOLIN HFA) 108 (90 BASE) MCG/ACT inhaler Inhale 1-2 puffs into the lungs every 6 (six) hours as needed for wheezing or shortness of breath. 07/01/14   Nat Christen, MD  ALPRAZolam Duanne Moron) 1 MG tablet Take 1 mg by mouth 3 (three) times daily.    [provider]  ciprofloxacin-hydrocortisone (CIPRO HC OTIC) OTIC suspension Place 3 drops into the left ear 2 (two) times daily. For 7 days 05/22/21   Triplett, Tammy, PA-C  cyclobenzaprine (FLEXERIL) 10 MG tablet Take 1 tablet (10 mg total) by mouth 3 (three) times daily as needed for muscle spasms. 01/12/18   Veryl Speak, MD  fluticasone (FLONASE) 50 MCG/ACT nasal spray Place 2 sprays daily as needed into both nostrils for allergies or rhinitis.    [provider]  hydrOXYzine (VISTARIL) 50 MG capsule Take 50 mg by  mouth as needed for anxiety (sleep).     [provider]  loratadine (CLARITIN) 10 MG tablet Take 10 mg daily by mouth.    [provider]  montelukast (SINGULAIR) 10 MG tablet Take 10 mg daily by mouth.    [provider]  naproxen (NAPROSYN) 500 MG tablet Take 1 tablet (500 mg total) by mouth 2 (two) times daily. 01/12/18   Veryl Speak, MD  simvastatin (ZOCOR) 40 MG tablet Take 40 mg by mouth every evening.     [provider]  theophylline (THEO-24) 300 MG 24 hr capsule Take 1 capsule (300 mg total) by mouth 3 (three) times daily. Patient taking differently: Take 300 mg by mouth as needed.  07/01/14   Nat Christen, MD      Allergies    Patient has no known allergies.    Review of Systems   Review of Systems  Respiratory:  Positive for cough, shortness of breath and wheezing.   All other systems reviewed and are negative.  Physical Exam Updated Vital Signs BP 115/75    Pulse (!) 108 Comment: while ambulating   Temp (!) 97.3 F (36.3 C)    Resp (!) 28 Comment: while ambulating   Ht 1.676 m (5\' 6" )    Wt 88 kg    LMP  (LMP Unknown)    SpO2 95% Comment: while ambulating   BMI 31.31 kg/m  Physical  Exam Vitals and nursing note reviewed.  Constitutional:      Appearance: She is well-developed. She is obese. She is not ill-appearing.  HENT:     Head: Normocephalic and atraumatic.  Eyes:     Pupils: Pupils are equal, round, and reactive to light.  Cardiovascular:     Rate and Rhythm: Normal rate and regular rhythm.     Heart sounds: Normal heart sounds.  Pulmonary:     Effort: Pulmonary effort is normal. No respiratory distress.     Breath sounds: Wheezing present.     Comments: Expiratory wheezing in all lung fields Abdominal:     General: Bowel sounds are normal.     Palpations: Abdomen is soft.  Musculoskeletal:     Cervical back: Neck supple.     Right lower leg: No tenderness. No edema.     Left lower leg: No tenderness. No edema.   Skin:    General: Skin is warm and dry.  Neurological:     Mental Status: She is alert and oriented to person, place, and time.  Psychiatric:        Mood and Affect: Mood normal.    ED Results / Procedures / Treatments   Labs (all labs ordered are listed, but only abnormal results are displayed) Labs Reviewed  BASIC METABOLIC PANEL - Abnormal; Notable for the following components:      Result Value   Potassium 3.4 (*)    Glucose, Bld 109 (*)    All other components within normal limits  RESP PANEL BY RT-PCR (FLU A&B, COVID) ARPGX2  CBC WITH DIFFERENTIAL/PLATELET    EKG EKG Interpretation  Date/Time:  Thursday September 15 2021 00:27:13 EST Ventricular Rate:  91 PR Interval:  172 QRS Duration: 85 QT Interval:  348 QTC Calculation: 429 R Axis:   -6 Text Interpretation: Sinus rhythm Confirmed by Thayer Jew [phone]) on 09/15/2021 1:34:25 AM  Radiology DG Chest Portable 1 View  Result Date: 09/15/2021 CLINICAL DATA:  Shortness of breath. EXAM: PORTABLE CHEST 1 VIEW COMPARISON:  Chest radiograph dated 10/24/2019. FINDINGS: No focal consolidation, pleural effusion, pneumothorax. The cardiac silhouette is within limits. No acute osseous pathology. Degenerative changes of the right shoulder with several calcific densities which may represent loose bodies. IMPRESSION: No active cardiopulmonary disease. Electronically Signed   By: Anner Crete M.D.   On: 09/15/2021 01:04    Procedures Procedures    Medications Ordered in ED Medications  albuterol (VENTOLIN HFA) 108 (90 Base) MCG/ACT inhaler 2 puff (2 puffs Inhalation Not Given 09/15/21 0220)  ipratropium-albuterol (DUONEB) 0.5-2.5 (3) MG/3ML nebulizer solution 3 mL (3 mLs Nebulization Given 09/15/21 0035)  dexamethasone (DECADRON) injection 10 mg (10 mg Intramuscular Given 09/15/21 0047)  ipratropium-albuterol (DUONEB) 0.5-2.5 (3) MG/3ML nebulizer solution 3 mL (3 mLs Nebulization Given 09/15/21 2633)    ED Course/ Medical  Decision Making/ A&P Clinical Course as of 09/15/21 0242  Thu Sep 15, 2021  0204 Gust work-up with patient.  No respiratory distress.  Satting 97% on room air.  Ambulated without difficulty with pulse ox maintaining.  Ordered 1 additional DuoNeb and a new inhaler.  Discussed smoking cessation. [CH]    Clinical Course User Index [CH] Jahliyah Trice, Barbette Hair, MD                           Medical Decision Making Amount and/or Complexity of Data Reviewed Labs: ordered. Radiology: ordered.  Risk Prescription drug management.   This patient  presents to the ED for concern of shortness of breath and we, this involves an extensive number of treatment options, and is a complaint that carries with it a high risk of complications and morbidity.  The differential diagnosis includes asthma exacerbation, pneumonia, viral illness, less likely heart failure  MDM:    This is a 51 year old female who presents with shortness of breath and wheezing.  History of asthma and smoking.  She is nontoxic.  She is wheezing on exam but is in no respiratory distress.  Patient was given a DuoNeb and Decadron.  Chest x-ray shows no evidence of pneumothorax or pneumonia.  COVID and influenza testing are negative.  Basic lab work including metabolic panel is negative.  Highly suspect ongoing asthma exacerbation.  See clinical course above.  She was given a second DuoNeb.  She was able to ambulate and maintain her pulse ox.  We discussed smoking cessation.  We will give her 1 additional dose of Decadron to take in 2 days if not improving.  Recommend ongoing albuterol use. (Labs, imaging)  Labs: I Ordered, and personally interpreted labs.  The pertinent results include: CBC, BMP normal  Imaging Studies ordered: I ordered imaging studies including chest x-ray normal I independently visualized and interpreted imaging. I agree with the radiologist interpretation  Additional history obtained from patient.  External records from  outside source obtained and reviewed including chart review  Critical Interventions: DuoNeb, IM Decadron  Consultations: I requested consultation with the none,  and discussed lab and imaging findings as well as pertinent plan - they recommend: None  Cardiac Monitoring: The patient was maintained on a cardiac monitor.  I personally viewed and interpreted the cardiac monitored which showed an underlying rhythm of: Normal sinus rhythm  Reevaluation: After the interventions noted above, I reevaluated the patient and found that they have :improved   Considered admission for: Refractory asthma  Social Determinants of Health: Ongoing smoker  Disposition: Discharge  Co morbidities that complicate the patient evaluation  Past Medical History:  Diagnosis Date   Ankle injury    Anxiety    Arthritis    Asthma    Back pain    Headache    Hyperlipidemia      Medicines Meds ordered this encounter  Medications   ipratropium-albuterol (DUONEB) 0.5-2.5 (3) MG/3ML nebulizer solution 3 mL   DISCONTD: dexamethasone (DECADRON) injection 10 mg   dexamethasone (DECADRON) injection 10 mg   ipratropium-albuterol (DUONEB) 0.5-2.5 (3) MG/3ML nebulizer solution 3 mL   albuterol (VENTOLIN HFA) 108 (90 Base) MCG/ACT inhaler 2 puff   dexamethasone (DECADRON) 6 MG tablet    Sig: Take 2 tablets (12 mg total) by mouth once for 1 dose. If not improving    Dispense:  2 tablet    Refill:  0    I have reviewed the patients home medicines and have made adjustments as needed  Problem List / ED Course: Problem List Items Addressed This Visit   None Visit Diagnoses     Moderate persistent asthma with exacerbation    -  Primary   Relevant Medications   ipratropium-albuterol (DUONEB) 0.5-2.5 (3) MG/3ML nebulizer solution 3 mL (Completed)   dexamethasone (DECADRON) injection 10 mg (Completed)   ipratropium-albuterol (DUONEB) 0.5-2.5 (3) MG/3ML nebulizer solution 3 mL (Completed)   albuterol (VENTOLIN  HFA) 108 (90 Base) MCG/ACT inhaler 2 puff   dexamethasone (DECADRON) 6 MG tablet (Start on 09/17/2021)  Final Clinical Impression(s) / ED Diagnoses Final diagnoses:  Moderate persistent asthma with exacerbation    Rx / DC Orders ED Discharge Orders          Ordered    dexamethasone (DECADRON) 6 MG tablet   Once        09/15/21 0240              Tamzin Bertling, Barbette Hair, MD 09/15/21 0246

## 2021-09-15 NOTE — Telephone Encounter (Signed)
Spoke with pt and let her know that we have received her clearance from PCP, but Dr. Amedeo Kinsman highly recommends the patient have some formal Smoking Cessation completed to help with her recovery. Pt informed me of multiple concerns to include transportation problems and recently being seen at the ER for asthma that may impede her from going back to Dr. Bartolo Darter office for smoking cessation. Let pt know that I would call PCP office to see what I could do to assist. Called Dr. Bartolo Darter office and an appointment was made for the patient on 10/13/21, I asked if PCP nurse or assistant would give me a call directly to see if we can work out a plan to best assist patient. Awaiting a call back at this time.

## 2021-09-15 NOTE — Discharge Instructions (Signed)
You were seen today for ongoing shortness of breath and wheezing.  This is likely related to an ongoing asthma exacerbation.  Use your inhaler every 2-4 hours for the next 12 hours.  You shoul quit smoking as this continues to cause harm to your health.

## 2021-09-26 ENCOUNTER — Telehealth: Payer: Self-pay | Admitting: *Deleted

## 2021-09-26 NOTE — Telephone Encounter (Signed)
Call received from Dr. Arliss Journey nurse, Amy, she states that the patient was there in their office. She was questioning what was needed as far as smoking cessation for the patient. She states they have tried chantix in the past without success. She advised that it is in their clearance letter that the patient has been counseled on smoking risk and complications. She stated they would reiterate with her today. The patient states she has cut back. If anything else is needed she said you can give them a call back.

## 2021-09-29 NOTE — Telephone Encounter (Signed)
Spoke with Dr. Bartolo Darter nurse, Amy, states pt was in their office 09/26/21. She has been counseled again on the risks of smoking, and was also set up with the Quit Line offered to [facility] residents. Amy states she is willing to set up the smoking blood test and repeat in 8 wks if you would like. Attempted to call pt as well to update her on all steps being taken, no answer. VM left with call back information in case of questions.

## 2021-10-24 ENCOUNTER — Telehealth: Payer: Self-pay | Admitting: Orthopedic Surgery

## 2021-10-24 DIAGNOSIS — M19011 Primary osteoarthritis, right shoulder: Secondary | ICD-10-CM

## 2021-10-24 DIAGNOSIS — Z01818 Encounter for other preprocedural examination: Secondary | ICD-10-CM

## 2021-10-24 NOTE — Telephone Encounter (Signed)
Patient called at 4:18 pm left voicemail stating she wants someone to call her back, she has called all last week and this week said no one has returned her call.   ? ?Please call the patient back.  ?

## 2021-10-24 NOTE — Telephone Encounter (Signed)
I did call the patient back and had to leave a voicemail for her to call our office back tomorrow.  ? ?Chloe Joyce please call the patient back.  ?

## 2021-10-25 NOTE — Telephone Encounter (Signed)
LVM for pt to call back.

## 2021-10-27 NOTE — Telephone Encounter (Signed)
Called and left another VM regarding some steps she can take and that all of it will be coordinated with her PCP. Call back information left as well.  ?

## 2021-10-27 NOTE — Telephone Encounter (Signed)
Patient returned call - I relayed that nurse has called her. Also Dr Amedeo Kinsman has called her. Asking if nurse can please call her back. She is concerned about what was left on her message regarding cutting down smoking or quitting smoking. Said no one is helping her with trying to quit. Please advise "on what she is to do" - states she may not be able to answer at abut 1:30pm, he time given by clinic staff. ?

## 2021-10-31 NOTE — Telephone Encounter (Signed)
Patient called back to discuss 'what she needs to do to get her surgery done.' Said she never was given literature or information about quitting smoking. Is it 8 weeks without smoking? Asking if we can please clarify, If so, can she be prescribed something for smoking cessation? Uses [employer]. Asking for the one that  begins with Wel.' Said she has gotten her clearance. From other doctor. ?States also her phone service is through her internet, which is hard to hear it ring - said best time to receive call is after 4:00pm ?

## 2021-11-09 ENCOUNTER — Other Ambulatory Visit: Payer: Self-pay

## 2021-11-09 ENCOUNTER — Institutional Professional Consult (permissible substitution): Payer: Medicaid Other | Admitting: Internal Medicine

## 2021-11-09 ENCOUNTER — Ambulatory Visit: Payer: Medicaid Other | Admitting: Internal Medicine

## 2021-11-09 ENCOUNTER — Encounter: Payer: Self-pay | Admitting: Internal Medicine

## 2021-11-09 DIAGNOSIS — J449 Chronic obstructive pulmonary disease, unspecified: Secondary | ICD-10-CM | POA: Diagnosis not present

## 2021-11-09 DIAGNOSIS — J4489 Other specified chronic obstructive pulmonary disease: Secondary | ICD-10-CM | POA: Insufficient documentation

## 2021-11-09 DIAGNOSIS — R490 Dysphonia: Secondary | ICD-10-CM

## 2021-11-09 DIAGNOSIS — F17210 Nicotine dependence, cigarettes, uncomplicated: Secondary | ICD-10-CM | POA: Insufficient documentation

## 2021-11-09 MED ORDER — BUDESONIDE-FORMOTEROL FUMARATE 80-4.5 MCG/ACT IN AERO: INHALATION_SPRAY | RESPIRATORY_TRACT | 12 refills | Status: DC

## 2021-11-09 NOTE — Progress Notes (Signed)
? ?Chloe Joyce, female    DOB: 1970/12/09,    MRN: [id number] ? ? ?Brief patient profile:  ?44  yowf active smoker  "asthma since childhood" self- referred to pulmonary clinic in [place] 11/09/2021 for AB and wants to be cleared for R shoulder surgery.  ? ? ? ? ?History of Present Illness  ?11/09/2021  Pulmonary/ 1st office eval/ Dean Goldner / [facility] maint on singulair and theophylline ?Chief Complaint  ?Patient presents with  ? Consult  ?  Consult for asthma   ?Dyspnea: with   housework/ can barely walk across parking lot/ leans buggy to do grocery ?Cough: variable but some worse in am and hs  ?Sleep: needs inhaler at least once each noct  ?SABA use: qid albuterol  ? ?No obvious other patterns in day to day or daytime variability or assoc excess/ purulent sputum or mucus plugs or hemoptysis or cp or chest tightness, subjective wheeze or overt sinus or hb symptoms.  ? ? . Also denies any obvious fluctuation of symptoms with weather or environmental changes or other aggravating or alleviating factors except as outlined above  ? ?No unusual exposure hx or h/o childhood pna/ asthma or knowledge of premature birth. ? ?Current Allergies, Complete Past Medical History, Past Surgical History, Family History, and Social History were reviewed in [employer] record. ? ?ROS  The following are not active complaints unless bolded ?Hoarseness, sore throat, dysphagia, dental problems, itching, sneezing,  nasal congestion or discharge of excess mucus or purulent secretions, ear ache,   fever, chills, sweats, unintended wt loss or wt gain, classically pleuritic or exertional cp,  orthopnea pnd or arm/hand swelling  or leg swelling, presyncope, palpitations, abdominal pain, anorexia, nausea, vomiting, diarrhea  or change in bowel habits or change in bladder habits, change in stools or change in urine, dysuria, hematuria,  rash, arthralgias, visual complaints, headache, numbness, weakness or ataxia or  problems with walking or coordination,  change in mood or  memory. ?      ?   ? ?Past Medical History:  ?Diagnosis Date  ? Ankle injury   ? Anxiety   ? Arthritis   ? Asthma   ? Back pain   ? Headache   ? Hyperlipidemia   ? ? ?Outpatient Medications Prior to Visit  ?Medication Sig Dispense Refill  ? albuterol (PROVENTIL HFA;VENTOLIN HFA) 108 (90 BASE) MCG/ACT inhaler Inhale 1-2 puffs into the lungs every 6 (six) hours as needed for wheezing or shortness of breath. 1 Inhaler 0  ? ALPRAZolam (XANAX) 1 MG tablet Take 1 mg by mouth 3 (three) times daily.    ? ciprofloxacin-hydrocortisone (CIPRO HC OTIC) OTIC suspension Place 3 drops into the left ear 2 (two) times daily. For 7 days 10 mL 0  ? cyclobenzaprine (FLEXERIL) 10 MG tablet Take 1 tablet (10 mg total) by mouth 3 (three) times daily as needed for muscle spasms. 15 tablet 0  ? fluticasone (FLONASE) 50 MCG/ACT nasal spray Place 2 sprays daily as needed into both nostrils for allergies or rhinitis.    ? hydrOXYzine (VISTARIL) 50 MG capsule Take 50 mg by mouth as needed for anxiety (sleep).     ? loratadine (CLARITIN) 10 MG tablet Take 10 mg daily by mouth.    ? montelukast (SINGULAIR) 10 MG tablet Take 10 mg daily by mouth.    ? naproxen (NAPROSYN) 500 MG tablet Take 1 tablet (500 mg total) by mouth 2 (two) times daily. 20 tablet 0  ? simvastatin (  ZOCOR) 40 MG tablet Take 40 mg by mouth every evening.     ? theophylline (THEO-24) 300 MG 24 hr capsule Take 1 capsule (300 mg total) by mouth 3 (three) times daily. (Patient taking differently: Take 300 mg by mouth as needed.) 60 capsule 1  ? ?No facility-administered medications prior to visit.  ? ? ? ?Objective:  ?  ? ?BP 138/88 (BP Location: Left Arm, Patient Position: Sitting)   Pulse 84   Temp 98.4 ?F (36.9 ?C) (Temporal)   Ht '5\' 7"'$  (1.702 m)   Wt 193 lb 9.6 oz (87.8 kg)   LMP  (LMP Unknown)   SpO2 97% Comment: ra  BMI 30.32 kg/m?  ? ?SpO2: 97 % (ra)  ? ?Hoarse wf rambling speech and  failed to answer a single  question asked in a straightforward manner, tending to go off on tangents or answer questions with ambiguous medical terms or diagnoses and seemed perplexed when asked the same question more than once for clarification.  ? ? HEENT : pt wearing mask not removed for exam due to covid -19 concerns.  ? ? ?NECK :  without JVD/Nodes/TM/ nl carotid upstrokes bilaterally ? ? ?LUNGS: no acc muscle use,  Nl contour chest with mostly noise transmitted from the supper airway  bilaterally without cough on insp or exp maneuvers ? ? ?CV:  RRR  no s3 or murmur or increase in P2, and no edema  ? ?ABD:  soft and nontender with nl inspiratory excursion in the supine position. No bruits or organomegaly appreciated, bowel sounds nl ? ?MS:  Nl gait/ ext warm without deformities, calf tenderness, cyanosis or clubbing.  R shoulder held in somewhwat frozen position ?  ? ?SKIN: warm and dry without lesions   ? ?NEURO:  alert, approp, nl sensorium with  no motor or cerebellar deficits apparent.  ? ? ? ?I personally reviewed images and agree with radiology impression as follows:  ?CXR:   portable 09/15/21  ?No focal consolidation, pleural effusion, pneumothorax. The cardiac ?silhouette is within limits. No acute osseous pathology. ?Degenerative changes of the right shoulder with several calcific ?densities which may represent loose bodies. ? ? ?I personally reviewed images and agree with radiology impression as follows:  ? Chest CTa 09/13/20 Upper lobe predominant emphysema  ? ?Labs ordered 11/09/2021  :  allergy profile   alpha one AT phenotype   ?   ?Assessment  ? ?Asthmatic bronchitis , chronic (HCC) ?Active smoker ?-  Chest CTa 09/13/20 Upper lobe predominant emphysema  ?- 11/09/2021  After extensive coaching inhaler device,  effectiveness =    75% from a baseline near 0 so rec symbicort 80 2bid and d/c theoph with approp saba  ?- Labs ordered 11/09/2021  :  allergy profile   alpha one AT phenotype   ? ?Goal is to minimize irritation of the  upper airway( gerd from theoph, high doses of inhalants) before bring back for full pfts ? ?Re SABA :  I spent extra time with pt today reviewing appropriate use of albuterol for prn use on exertion with the following points: ?1) saba is for relief of sob that does not improve by walking a slower pace or resting but rather if the pt does not improve after trying this first. ?2) If the pt is convinced, as many are, that saba helps recover from activity faster then it's easy to tell if this is the case by re-challenging : ie stop, take the inhaler, then p 5 minutes  try the exact same activity (intensity of workload) that just caused the symptoms and see if they are substantially diminished or not after saba ?3) if there is an activity that reproducibly causes the symptoms, try the saba 15 min before the activity on alternate days  ? ?If in fact the saba really does help, then fine to continue to use it prn but advised may need to look closer at the maintenance regimen being used to achieve better control of airways disease with exertion.  ? ? ? ?Hoarseness ?Onset ? > ent referral 11/09/2021 with assoc upper airway wheezing  ? ?Given smoken hx and hoarness plus UAO pattern breathing need to r/o laryngeal obst/ca  ? ?Discussed in detail all the  indications, usual  risks and alternatives  relative to the benefits with patient who agrees to proceed with w/u as outlined.    ? ? ? ?Cigarette smoker ?Counseled re importance of smoking cessation but did not meet time criteria for separate billing   ? ?rec return in 4 weeks with all meds in hand using a trust but verify approach to confirm accurate Medication  Reconciliation The principal here is that until we are certain that the  patients are doing what we've asked, it makes no sense to ask them to do more.  ? ?Each maintenance medication was reviewed in detail including emphasizing most importantly the difference between maintenance and prns and under what circumstances the  prns are to be triggered using an action plan format where appropriate. ? ?Total time for H and P, chart review, counseling, reviewing hfa  device(s) and generating customized AVS unique to this initial  office vi

## 2021-11-09 NOTE — Assessment & Plan Note (Addendum)
Counseled re importance of smoking cessation but did not meet time criteria for separate billing   ? ?    ?rec return in 4 weeks with all meds in hand using a trust but verify approach to confirm accurate Medication  Reconciliation The principal here is that until we are certain that the  patients are doing what we've asked, it makes no sense to ask them to do more.  ? ?Each maintenance medication was reviewed in detail including emphasizing most importantly the difference between maintenance and prns and under what circumstances the prns are to be triggered using an action plan format where appropriate. ? ?Total time for H and P, chart review, counseling, reviewing hfa  device(s) and generating customized AVS unique to this initial  office visit / same day charting  > 60 min  ?     ?

## 2021-11-09 NOTE — Assessment & Plan Note (Addendum)
Active smoker ?-  Chest CTa 09/13/20 Upper lobe predominant emphysema  ?- 11/09/2021  After extensive coaching inhaler device,  effectiveness =    75% from a baseline near 0 so rec symbicort 80 2bid and d/c theoph with approp saba  ?- Labs ordered 11/09/2021  :  allergy profile   alpha one AT phenotype   ? ?Goal is to minimize irritation of the upper airway( gerd from theoph, high doses of inhalants) before bring back for full pfts ? ?Re SABA :  I spent extra time with pt today reviewing appropriate use of albuterol for prn use on exertion with the following points: ?1) saba is for relief of sob that does not improve by walking a slower pace or resting but rather if the pt does not improve after trying this first. ?2) If the pt is convinced, as many are, that saba helps recover from activity faster then it's easy to tell if this is the case by re-challenging : ie stop, take the inhaler, then p 5 minutes try the exact same activity (intensity of workload) that just caused the symptoms and see if they are substantially diminished or not after saba ?3) if there is an activity that reproducibly causes the symptoms, try the saba 15 min before the activity on alternate days  ? ?If in fact the saba really does help, then fine to continue to use it prn but advised may need to look closer at the maintenance regimen being used to achieve better control of airways disease with exertion.  ?

## 2021-11-09 NOTE — Patient Instructions (Addendum)
.  Plan A = Automatic = Always=    Symbicort 80 Take 2 puffs first thing in am and then another 2 puffs about 12 hours later and within a week you should be able to stop the theophylline permanently  ?  ?Work on inhaler technique:  relax and gently blow all the way out then take a nice smooth full deep breath back in, triggering the inhaler at same time you start breathing in.  Hold for up to 5 seconds if you can. Blow out thru nose. Rinse and gargle with water when done.  If mouth or throat bother you at all,  try brushing teeth/gums/tongue with arm and hammer toothpaste/ make a slurry and gargle and spit out.  ? ? ?Plan B = Backup (to supplement plan A, not to replace it) ?Only use your albuterol inhaler as a rescue medication to be used if you can't catch your breath by resting or doing a relaxed purse lip breathing pattern.  ?- The less you use it, the better it will work when you need it. ?- Ok to use the inhaler up to 2 puffs  every 4 hours if you must but call for appointment if use goes up over your usual need ?- Don't leave home without it !!  (think of it like starter fluid  your car)  ? ? ?Plan C = Crisis (instead of Plan B but only if Plan B stops working) ?- only use your albuterol nebulizer if you first try Plan B and it fails to help > ok to use the nebulizer up to every 4 hours but if start needing it regularly call for immediate appointment ? ?Please remember to go to the lab department   for your tests - we will call you with the results when they are available. ?    ? ?I will be referring you for ENT evaluation  ? ?Please schedule a follow up office visit in 4 weeks, call sooner if needed with all medications /inhalers/ solutions in hand so we can verify exactly what you are taking. This includes all medications from all doctors and over the [place] separate them into two bags:  the ones you take automatically, no matter what, vs the ones you take just when you feel you need them "BAG #2  is UP TO YOU"  - this will really help [place] help you take your medications more effectively.  ? ? ? ? ? ? ? ? ?  ?

## 2021-11-09 NOTE — Telephone Encounter (Signed)
Called pt to discuss concerns regarding her surgery.  I attempted to tell the patient that Dr. Amedeo Kinsman wants her to quit or drastically change her smoking habits and that she needed at least 8 wks of smoking cessation, not only because of anesthesia concerns but for her healing/recovery to include her rehab after surgery. Also let pt know that Dr. Amedeo Kinsman does not provide any smoking cessation treatment and that it's usually handled by the PCP. Let her know that I've spoke with the nurse there who states there is a lab test that can be done to make sure her levels of nicotine decrease.  Pt states she's getting the run around and that Dr. Bartolo Darter nurse has told her that there is nothing else they can do for her at that office since they've already cleared her for surgery. Says that they did sign her up for a smoking cessation hotline but she doesn't know if anyone has ever called her to start the program. Pt states she is only smoking 2-4 cigarettes a wk at this time. I let pt know how previous notes say she's denied smoking cessation, which she states she didn't do. Pt also went into wanting to start medications and that her PCP will not do anything else for you. After talking to the patient for over 30 mins I let her know that I will speak with Dr. Amedeo Kinsman and her PCP office so that I can get a definitive plan for her and call her back. Let her know that an option could be her coming back into the clinic and talk to Dr. Amedeo Kinsman.  ?

## 2021-11-09 NOTE — Assessment & Plan Note (Signed)
Onset ? > ent referral 11/09/2021 with assoc upper airway wheezing  ? ?Given smoken hx and hoarness plus UAO pattern breathing need to r/o laryngeal obst/ca  ? ?Discussed in detail all the  indications, usual  risks and alternatives  relative to the benefits with patient who agrees to proceed with w/u as outlined.    ?

## 2021-11-10 NOTE — Telephone Encounter (Addendum)
Noted. *Update: patient returned call - left voice message at 11:44am, missed Leta's call; requesting a call back. ?*Additional update: Voice message just received also from [facility], per Amy, regarding clearance. Please call back to direct ph# [phone], ext [phone] ?

## 2021-11-10 NOTE — Telephone Encounter (Signed)
Called Dr. Bartolo Darter office to talk with someone from the team. To see is we can clear up information regarding pt. LVM for pt to call back and schedule appointment with Dr. Amedeo Kinsman to discuss a plan for surgery.  ?

## 2021-11-10 NOTE — Telephone Encounter (Signed)
Called pt and left another message to book an appointment. Also, left message with Amy for a call back.  ?

## 2021-11-11 LAB — CBC WITH DIFFERENTIAL/PLATELET
Basophils Absolute: 0.1 10*3/uL (ref 0.0–0.2)
Basos: 1 %
EOS (ABSOLUTE): 0.6 10*3/uL — ABNORMAL HIGH (ref 0.0–0.4)
Eos: 9 %
Hematocrit: 41.8 % (ref 34.0–46.6)
Hemoglobin: 14 g/dL (ref 11.1–15.9)
Immature Grans (Abs): 0 10*3/uL (ref 0.0–0.1)
Immature Granulocytes: 1 %
Lymphocytes Absolute: 2.4 10*3/uL (ref 0.7–3.1)
Lymphs: 37 %
MCH: 29.5 pg (ref 26.6–33.0)
MCHC: 33.5 g/dL (ref 31.5–35.7)
MCV: 88 fL (ref 79–97)
Monocytes Absolute: 0.8 10*3/uL (ref 0.1–0.9)
Monocytes: 12 %
Neutrophils Absolute: 2.5 10*3/uL (ref 1.4–7.0)
Neutrophils: 40 %
Platelets: 348 10*3/uL (ref 150–450)
RBC: 4.75 x10E6/uL (ref 3.77–5.28)
RDW: 12.6 % (ref 11.7–15.4)
WBC: 6.3 10*3/uL (ref 3.4–10.8)

## 2021-11-11 LAB — IGE: IgE (Immunoglobulin E), Serum: 830 IU/mL — ABNORMAL HIGH (ref 6–495)

## 2021-11-11 LAB — ALPHA-1-ANTITRYPSIN PHENOTYP: A-1 Antitrypsin: 136 mg/dL (ref 101–187)

## 2021-11-14 ENCOUNTER — Telehealth: Payer: Self-pay | Admitting: Internal Medicine

## 2021-11-14 NOTE — Telephone Encounter (Signed)
ATC patient again regarding lab results. No answer. ?

## 2021-11-15 NOTE — Telephone Encounter (Signed)
Call addressed in prev encounter  ?

## 2021-11-16 NOTE — Telephone Encounter (Signed)
Called and spoke with Dr Bartolo Darter nurse, Amy, states Dr. Bartolo Darter has recently started pt on Wellbutrin. We can also order the lab with suggested f/u to ensure nicotine levels are decreasing. Pt had a referral placed to [place] Quit line previously that was closed due to pt not returning call. Amy states another referral was placed, and extensive counseling was done by Dr. Bartolo Darter about surgical risks and concerns with smoking. I have pended the lab that is needed to complete the bloodwork in case you choose. We are still waiting on a call back from patient to schedule an appointment at this time.  ?

## 2021-11-16 NOTE — Telephone Encounter (Signed)
Called and LVM letting pt know we are waiting for her to schedule an appointment to discuss next steps. ?

## 2021-11-17 ENCOUNTER — Other Ambulatory Visit: Payer: Self-pay

## 2021-11-17 ENCOUNTER — Telehealth: Payer: Self-pay

## 2021-11-17 DIAGNOSIS — J449 Chronic obstructive pulmonary disease, unspecified: Secondary | ICD-10-CM

## 2021-11-17 DIAGNOSIS — R490 Dysphonia: Secondary | ICD-10-CM

## 2021-11-17 NOTE — Telephone Encounter (Signed)
Patient called and states she does not want to do allergy referral now. She states she wants to handle ENT first. Order cancelled. Nothing further needed.  ?

## 2021-11-17 NOTE — Telephone Encounter (Signed)
Patient came in to clinic on 3/29 around 1615, we discussed steps that have been taken and let her know she needs an appointment with Dr. Amedeo Kinsman. After going over information with pt for 20 mins, appointment was made and pt agrees to have baseline labs completed at this time.  ?

## 2021-11-17 NOTE — Addendum Note (Signed)
Addended by: Fritzi Mandes D on: 11/17/2021 05:03 PM ? ? Modules accepted: Orders ? ?

## 2021-11-18 ENCOUNTER — Telehealth: Payer: Self-pay

## 2021-11-18 NOTE — Telephone Encounter (Signed)
ATC patient. LVM letting patient know Dr. Morrison Old recommendations (ok per patient yesterday to leave detailed vm). Advised her to call back to confirm with front desk staff if she does want to cancel appt.  ? ?Nothing further needed from clinical staff at this time. ?

## 2021-11-18 NOTE — Telephone Encounter (Signed)
Would wait until after surgery and if breathing not a whole lot better happy to see in office with all meds in hand and discuss allergy eval then if she prefers  ?

## 2021-11-18 NOTE — Telephone Encounter (Signed)
Patient called after going over result note reply from Dr. Melvyn Novas and does not want to do allergy referral at this time. She states she saw ENT and they noted a polyp in her throat and she has to have surgery, she does not want to keep appt on April 19th with [facility] since her issue is mainly in her throat. Patient wants to know if/when she needs to come back to Pulmonary. Dr. Melvyn Novas please advise  ?

## 2021-11-22 ENCOUNTER — Institutional Professional Consult (permissible substitution): Payer: Medicaid Other | Admitting: Internal Medicine

## 2021-11-22 ENCOUNTER — Encounter: Payer: Self-pay | Admitting: Orthopedic Surgery

## 2021-11-22 ENCOUNTER — Ambulatory Visit: Payer: Medicaid Other | Admitting: Orthopedic Surgery

## 2021-11-22 VITALS — BP 122/84 | HR 113 | Ht 67.0 in | Wt 191.0 lb

## 2021-11-22 DIAGNOSIS — M19011 Primary osteoarthritis, right shoulder: Secondary | ICD-10-CM

## 2021-11-23 ENCOUNTER — Encounter: Payer: Self-pay | Admitting: Orthopedic Surgery

## 2021-11-23 NOTE — Progress Notes (Signed)
Orthopaedic Clinic Return ? ?Assessment: ?Chloe Joyce is a 51 y.o. female with the following: ?Severe right glenohumeral arthritis; in setting of remote right shoulder reconstruction ? ?Plan: ?[name] has advanced right glenohumeral arthritis, in the setting of a prior procedure.  She has pain and severely limited function as a result.  We have discussed proceeding with right shoulder arthroplasty, and she is interested.  However, she is a heavy smoker.  She has cut back considerably, but continues to smoke.  In conjunction with her PCP, we have recommended that she work to quit smoking.  I would like to see her completely off of cigarettes for a period of up to 8 weeks.  We will obtain a nicotine blood level today, to serve as a baseline.  This will be repeated in approximately 4 weeks.  If the numbers continue to improve, we can move forward with planning for surgery. ? ?Of note, she was recently evaluated in the emergency department due to some breathing issues.  Subsequent to this, a polyp was found in her throat.  It is unclear what the plan is regarding this polyp.  Nonetheless, I think this is more important than proceeding with shoulder arthroplasty.  I suspect that anesthesia would also recommend that this polyp be evaluated prior to proceeding with general anesthesia for right shoulder arthroplasty.  I will contact the specialist taking care of her for this polyp, to get some more information regarding their expectations.  It is highly likely that they are recommending she stop smoking prior to proceeding with surgery to remove the polyp as well. ? ?Prior to surgery, we will probably have to obtain an MRI to assess the integrity of the rotator cuff.  We will also obtain a CT scan prior to surgery, for preoperative planning. ? ?Follow-up: ?Return if symptoms worsen or fail to improve. ? ? ?Subjective: ? ?Chief Complaint  ?Patient presents with  ? right shoulder pain  ?  Here to discuss surgery, and  preoperative planning  ? ? ?History of Present Illness: ?Chloe Joyce is a 51 y.o. female who returns to clinic for repeat evaluation of right shoulder pain.  She has severe right shoulder pain, as well as limited motion.  She has obtained medical clearance, but she has not quit smoking.  There has been some confusion regarding the plan for smoking.  She has tried Wellbutrin in the past, and tried it once again, but had some adverse reactions, including mood swings.  As a result, she stopped this medication.  Nobody has called her regarding smoking cessation.  We have tried to obtain a nicotine level, so we can monitor this closely but this has not been completed yet.  She is very frustrated with the pain in her right shoulder.  She states that she recovered fine from her prior surgery, and she was smoking at that time as well.  She is here to discuss the neck steps regarding scheduling her surgery. ? ?Review of Systems: ?No fevers or chills ?No numbness or tingling ?No chest pain ?No shortness of breath ?No bowel or bladder dysfunction ?No GI distress ?No headaches ? ? ?Objective: ?BP 122/84   Pulse (!) 113   Ht '5\' 7"'$  (1.702 m)   Wt 191 lb (86.6 kg)   LMP  (LMP Unknown)   BMI 29.91 kg/m?  ? ?Physical Exam: ? ?Alert and oriented.  No acute distress. ? ?Evaluation the right shoulder demonstrates a well-healed anterior based surgical incision.  No surrounding  erythema or drainage.  Forward flexion is limited to 100 degrees.  Restricted external rotation at her side.  Crepitus is appreciated with range of motion.  Fingers are warm and well-perfused.  Sensations intact over the axillary nerve distribution.  Sensation is intact to the right hand. ? ?IMAGING: ?I personally ordered and reviewed the following images: ? ? ?Mordecai Rasmussen, MD ?11/23/2021 ?9:14 AM ? ? ?

## 2021-11-24 LAB — NICOTINE/COTININE SP
Cotinine: 56 ng/mL
Nicotine screen: <2 ng/mL

## 2021-12-01 ENCOUNTER — Telehealth: Payer: Self-pay

## 2021-12-01 NOTE — Telephone Encounter (Signed)
Pt called and asked about her lab results, let her know that provider isn't in the office but I read off results and ranges to her. Spoke with patient on the phone for over 15 mins. reiterating that we will give her a call when Dr. Amedeo Kinsman returns to let her know what the plan is from this point. Pt didn't sound to assured getting off the phone, states she doesn't understand why we can't just schedule her surgery since her #'s are on the low range, and that the polyp doesn't bother her breathing anymore. Please advise on next steps.  ?

## 2021-12-06 ENCOUNTER — Telehealth: Payer: Self-pay | Admitting: Radiology

## 2021-12-06 NOTE — Telephone Encounter (Signed)
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  They have discussed it but have not set a surgery date.   ?

## 2021-12-07 ENCOUNTER — Ambulatory Visit: Payer: Medicaid Other | Admitting: Internal Medicine

## 2021-12-08 NOTE — Telephone Encounter (Signed)
Spoke to pt and let her know what information was given to [place] from ENT and Anesthesiologist. Pt states she will call and get things scheduled with ENT. Will call us when she gets things done with ENT.  ?

## 2021-12-14 ENCOUNTER — Other Ambulatory Visit: Payer: Self-pay | Admitting: Otolaryngology

## 2021-12-29 NOTE — Pre-Procedure Instructions (Signed)
[facility] CALL ? ?Unable to reach patient by phone- message left with instructions to voicemail with call back number for questions/concerns.  ? ?PCP - Vidal Schwalbe, MD ? ?PPM/ICD - N/A ?Chest x-ray - 09/15/21 ?EKG - 09/15/21 ? ?COVID TEST- N/A ? ? ? ? ?Surgical Instructions ? ? ? Your procedure is scheduled on Friday, May 12 ? Report to [facility] Main Entrance "A" at 9:30 A.M., then check in with the Admitting office. ? Call this number if you have problems the morning of surgery: ? [phone] ? ? ? Remember: ? Do not eat or drink after midnight the night before your surgery ? ? ? Take these medicines the morning of surgery with A SIP OF WATER:  ?cyclobenzaprine (FLEXERIL) ?fenofibrate (TRICOR)  ?loratadine (CLARITIN) ?montelukast (SINGULAIR)  ?albuterol (PROVENTIL HFA;VENTOLIN HFA) if needed ?ALPRAZolam Duanne Moron) if needed ?fluticasone [facility]) if needed ?theophylline (THEO-24) if needed ?traMADol Veatrice Bourbon)  if needed ? ?Please bring all inhalers with you the day of surgery.  ? ? ?As of today, STOP taking any Aspirin (unless otherwise instructed by your surgeon) Aleve, Naproxen, Ibuprofen, Motrin, Advil, Goody's, BC's, all herbal medications, fish oil, and all vitamins. ? ?Do NOT Smoke (Tobacco/Vaping)  24 hours prior to your procedure ?  ?Contacts, glasses, hearing aids, dentures or partials may not be worn into surgery, please bring cases for these belongings ?  ?Patients discharged the day of surgery will not be allowed to drive home, and someone needs to stay with them for 24 hours. ? ? ?SURGICAL WAITING ROOM VISITATION ?No visitors are allowed in pre-op area with patient.  ? ? ? ?Special instructions:   ? ?Day of Surgery: ? ?Take a shower the day of or night before with antibacterial soap. ?Wear Clean/Comfortable clothing the morning of surgery ?Do not apply any deodorants/lotions.   ?Do not wear jewelry or makeup ?Do not wear lotions, powders, perfumes/colognes, or deodorant. ?Do not shave 48 hours prior to  surgery.  Men may shave face and neck. ?Do not bring valuables to the hospital. ?Do not wear nail polish, gel polish, artificial nails, or any other type of covering on natural nails (fingers and toes) ?If you have artificial nails or gel coating that need to be removed by a nail salon, please have this removed prior to surgery. Artificial nails or gel coating may interfere with anesthesia's ability to adequately monitor your vital signs. ?Remember to brush your teeth WITH YOUR REGULAR TOOTHPASTE. ? ? ? ? ? ?

## 2021-12-30 ENCOUNTER — Other Ambulatory Visit: Payer: Self-pay

## 2021-12-30 ENCOUNTER — Encounter (HOSPITAL_COMMUNITY): Payer: Self-pay | Admitting: Otolaryngology

## 2021-12-30 ENCOUNTER — Encounter (HOSPITAL_COMMUNITY)
Admission: RE | Disposition: A | Discharge status: Home / Self Care | Payer: Self-pay | Source: Home / Self Care | Attending: Otolaryngology

## 2021-12-30 ENCOUNTER — Ambulatory Visit (HOSPITAL_BASED_OUTPATIENT_CLINIC_OR_DEPARTMENT_OTHER): Payer: Medicaid Other | Admitting: Anesthesiology

## 2021-12-30 ENCOUNTER — Ambulatory Visit (HOSPITAL_COMMUNITY): Payer: Medicaid Other | Admitting: Anesthesiology

## 2021-12-30 ENCOUNTER — Ambulatory Visit (HOSPITAL_COMMUNITY)
Admission: RE | Admit: 2021-12-30 | Discharge: 2021-12-30 | Disposition: A | Discharge status: Home / Self Care | Payer: Medicaid Other | Attending: Otolaryngology | Admitting: Otolaryngology

## 2021-12-30 DIAGNOSIS — J45909 Unspecified asthma, uncomplicated: Secondary | ICD-10-CM | POA: Insufficient documentation

## 2021-12-30 DIAGNOSIS — F419 Anxiety disorder, unspecified: Secondary | ICD-10-CM | POA: Insufficient documentation

## 2021-12-30 DIAGNOSIS — F17210 Nicotine dependence, cigarettes, uncomplicated: Secondary | ICD-10-CM | POA: Diagnosis not present

## 2021-12-30 DIAGNOSIS — J381 Polyp of vocal cord and larynx: Secondary | ICD-10-CM | POA: Diagnosis present

## 2021-12-30 DIAGNOSIS — R062 Wheezing: Secondary | ICD-10-CM | POA: Diagnosis not present

## 2021-12-30 DIAGNOSIS — M1990 Unspecified osteoarthritis, unspecified site: Secondary | ICD-10-CM | POA: Insufficient documentation

## 2021-12-30 DIAGNOSIS — Z20822 Contact with and (suspected) exposure to covid-19: Secondary | ICD-10-CM | POA: Insufficient documentation

## 2021-12-30 HISTORY — PX: CO2 LASER APPLICATION: SHX5778

## 2021-12-30 HISTORY — PX: MICROLARYNGOSCOPY: SHX5208

## 2021-12-30 LAB — CBC
HCT: 41.8 % (ref 36.0–46.0)
Hemoglobin: 13.9 g/dL (ref 12.0–15.0)
MCH: 29.9 pg (ref 26.0–34.0)
MCHC: 33.3 g/dL (ref 30.0–36.0)
MCV: 89.9 fL (ref 80.0–100.0)
Platelets: 395 10*3/uL (ref 150–400)
RBC: 4.65 MIL/uL (ref 3.87–5.11)
RDW: 13.7 % (ref 11.5–15.5)
WBC: 7.8 10*3/uL (ref 4.0–10.5)
nRBC: 0 % (ref 0.0–0.2)

## 2021-12-30 LAB — BASIC METABOLIC PANEL
Anion gap: 11 (ref 5–15)
BUN: 8 mg/dL (ref 6–20)
CO2: 23 mmol/L (ref 22–32)
Calcium: 9.4 mg/dL (ref 8.9–10.3)
Chloride: 106 mmol/L (ref 98–111)
Creatinine, Ser: 0.79 mg/dL (ref 0.44–1.00)
GFR, Estimated: >60 mL/min (ref >60)
Glucose, Bld: 98 mg/dL (ref 70–99)
Potassium: 4.2 mmol/L (ref 3.5–5.1)
Sodium: 140 mmol/L (ref 135–145)

## 2021-12-30 LAB — SARS CORONAVIRUS 2 BY RT PCR: SARS Coronavirus 2 by RT PCR: NEGATIVE

## 2021-12-30 SURGERY — MICROLARYNGOSCOPY
Anesthesia: General | Site: Throat | Laterality: Left

## 2021-12-30 MED ORDER — FENTANYL CITRATE (PF) 250 MCG/5ML IJ SOLN
INTRAMUSCULAR | Status: AC
  Filled 2021-12-30: qty 5

## 2021-12-30 MED ORDER — MIDAZOLAM HCL 2 MG/2ML IJ SOLN
INTRAMUSCULAR | Status: DC | PRN
  Administered 2021-12-30: 2 mg via INTRAVENOUS

## 2021-12-30 MED ORDER — ROCURONIUM BROMIDE 100 MG/10ML IV SOLN
INTRAVENOUS | Status: DC | PRN
  Administered 2021-12-30: 50 mg via INTRAVENOUS

## 2021-12-30 MED ORDER — ONDANSETRON HCL 4 MG/2ML IJ SOLN: 4.0000 mg | Freq: Four times a day (QID) | INTRAMUSCULAR | Status: DC | PRN

## 2021-12-30 MED ORDER — PROPOFOL 10 MG/ML IV BOLUS
INTRAVENOUS | Status: DC | PRN
  Administered 2021-12-30: 50 mg via INTRAVENOUS
  Administered 2021-12-30: 100 mg via INTRAVENOUS

## 2021-12-30 MED ORDER — SUGAMMADEX SODIUM 200 MG/2ML IV SOLN
INTRAVENOUS | Status: DC | PRN
  Administered 2021-12-30: 200 mg via INTRAVENOUS

## 2021-12-30 MED ORDER — ORAL CARE MOUTH RINSE: 15.0000 mL | Freq: Once | OROMUCOSAL | Status: AC

## 2021-12-30 MED ORDER — FENTANYL CITRATE (PF) 250 MCG/5ML IJ SOLN
INTRAMUSCULAR | Status: DC | PRN
  Administered 2021-12-30: 50 ug via INTRAVENOUS
  Administered 2021-12-30: 100 ug via INTRAVENOUS
  Administered 2021-12-30: 50 ug via INTRAVENOUS

## 2021-12-30 MED ORDER — OXYCODONE HCL 5 MG/5ML PO SOLN: 5.0000 mg | Freq: Once | ORAL | Status: DC | PRN

## 2021-12-30 MED ORDER — OXYCODONE HCL 5 MG PO TABS: 5.0000 mg | ORAL_TABLET | Freq: Once | ORAL | Status: DC | PRN

## 2021-12-30 MED ORDER — FENTANYL CITRATE (PF) 100 MCG/2ML IJ SOLN: 25.0000 ug | INTRAMUSCULAR | Status: DC | PRN

## 2021-12-30 MED ORDER — CEFAZOLIN SODIUM-DEXTROSE 2-4 GM/100ML-% IV SOLN
2.0000 g | INTRAVENOUS | Status: AC
  Administered 2021-12-30: 2 g via INTRAVENOUS
  Filled 2021-12-30: qty 100

## 2021-12-30 MED ORDER — CHLORHEXIDINE GLUCONATE 0.12 % MT SOLN
15.0000 mL | Freq: Once | OROMUCOSAL | Status: AC
  Administered 2021-12-30: 15 mL via OROMUCOSAL
  Filled 2021-12-30: qty 15

## 2021-12-30 MED ORDER — DEXAMETHASONE SODIUM PHOSPHATE 10 MG/ML IJ SOLN
INTRAMUSCULAR | Status: DC | PRN
  Administered 2021-12-30: 10 mg via INTRAVENOUS

## 2021-12-30 MED ORDER — 0.9 % SODIUM CHLORIDE (POUR BTL) OPTIME
TOPICAL | Status: DC | PRN
  Administered 2021-12-30: 1000 mL

## 2021-12-30 MED ORDER — ONDANSETRON HCL 4 MG/2ML IJ SOLN
INTRAMUSCULAR | Status: DC | PRN
  Administered 2021-12-30: 4 mg via INTRAVENOUS

## 2021-12-30 MED ORDER — MIDAZOLAM HCL 2 MG/2ML IJ SOLN
INTRAMUSCULAR | Status: AC
  Filled 2021-12-30: qty 2

## 2021-12-30 MED ORDER — PROPOFOL 500 MG/50ML IV EMUL
INTRAVENOUS | Status: DC | PRN
Start: 2021-12-30 — End: 2021-12-30
  Administered 2021-12-30: 100 ug/kg/min via INTRAVENOUS

## 2021-12-30 MED ORDER — PROPOFOL 10 MG/ML IV BOLUS
INTRAVENOUS | Status: AC
  Filled 2021-12-30: qty 20

## 2021-12-30 MED ORDER — LIDOCAINE 2% (20 MG/ML) 5 ML SYRINGE
INTRAMUSCULAR | Status: DC | PRN
  Administered 2021-12-30: 100 mg via INTRAVENOUS

## 2021-12-30 MED ORDER — EPINEPHRINE PF 1 MG/ML IJ SOLN
INTRAMUSCULAR | Status: DC | PRN
  Administered 2021-12-30: 1 mg

## 2021-12-30 MED ORDER — LACTATED RINGERS IV SOLN: INTRAVENOUS | Status: DC

## 2021-12-30 MED ORDER — EPINEPHRINE HCL (NASAL) 0.1 % NA SOLN
NASAL | Status: AC
  Filled 2021-12-30: qty 30

## 2021-12-30 SURGICAL SUPPLY — 34 items
BAG COUNTER SPONGE SURGICOUNT (BAG) ×3 IMPLANT
BAG SPNG CNTER NS LX DISP (BAG) ×1
BALLN PULM 12 13.5 15X75 (BALLOONS)
BALLN PULMONARY 10-12 (MISCELLANEOUS) IMPLANT
BALLOON PULM 12 13.5 15X75 (BALLOONS) IMPLANT
BLADE SURG 15 STRL LF DISP TIS (BLADE) IMPLANT
BLADE SURG 15 STRL SS (BLADE)
BNDG EYE OVAL (GAUZE/BANDAGES/DRESSINGS) ×6 IMPLANT
CANISTER SUCT 3000ML PPV (MISCELLANEOUS) ×3 IMPLANT
CNTNR URN SCR LID CUP LEK RST (MISCELLANEOUS) IMPLANT
CONT SPEC 4OZ STRL OR WHT (MISCELLANEOUS)
COVER BACK TABLE 60X90IN (DRAPES) ×3 IMPLANT
COVER MAYO STAND STRL (DRAPES) ×3 IMPLANT
DRAPE HALF SHEET 40X57 (DRAPES) ×3 IMPLANT
GAUZE SPONGE 4X4 12PLY STRL (GAUZE/BANDAGES/DRESSINGS) ×3 IMPLANT
GLOVE BIO SURGEON STRL SZ7.5 (GLOVE) ×3 IMPLANT
GOWN STRL REUS W/ TWL LRG LVL3 (GOWN DISPOSABLE) IMPLANT
GOWN STRL REUS W/TWL LRG LVL3 (GOWN DISPOSABLE)
KIT BASIN OR (CUSTOM PROCEDURE TRAY) ×3 IMPLANT
KIT TURNOVER KIT B (KITS) ×3 IMPLANT
NDL HYPO 25GX1X1/2 BEV (NEEDLE) IMPLANT
NEEDLE HYPO 25GX1X1/2 BEV (NEEDLE) IMPLANT
NS IRRIG 1000ML POUR BTL (IV SOLUTION) ×3 IMPLANT
PAD ARMBOARD 7.5X6 YLW CONV (MISCELLANEOUS) ×6 IMPLANT
PATTIES SURGICAL .5 X1 (DISPOSABLE) ×3 IMPLANT
PATTIES SURGICAL .5 X3 (DISPOSABLE) ×3 IMPLANT
POSITIONER HEAD DONUT 9IN (MISCELLANEOUS) IMPLANT
SOL ANTI FOG 6CC (MISCELLANEOUS) ×2 IMPLANT
SOLUTION ANTI FOG 6CC (MISCELLANEOUS) ×1
SURGILUBE 2OZ TUBE FLIPTOP (MISCELLANEOUS) IMPLANT
SUT SILK 2 0 PERMA HAND 18 BK (SUTURE) IMPLANT
TOWEL GREEN STERILE FF (TOWEL DISPOSABLE) ×3 IMPLANT
TUBE CONNECTING 12X1/4 (SUCTIONS) ×3 IMPLANT
WATER STERILE IRR 1000ML POUR (IV SOLUTION) ×3 IMPLANT

## 2021-12-30 NOTE — Anesthesia Postprocedure Evaluation (Signed)
Anesthesia Post Note ? ?Patient: Chloe Joyce ? ?Procedure(s) Performed: MICROLARYNGOSCOPY WITH VOCAL CORD BIOPSY (Left: Throat) ?CO2 LASER APPLICATION (Left: Throat) ? ?  ? ?Patient location during evaluation: PACU ?Anesthesia Type: General ?Level of consciousness: awake and alert ?Pain management: pain level controlled ?Vital Signs Assessment: post-procedure vital signs reviewed and stable ?Respiratory status: spontaneous breathing, nonlabored ventilation, respiratory function stable and patient connected to nasal cannula oxygen ?Cardiovascular status: blood pressure returned to baseline and stable ?Postop Assessment: no apparent nausea or vomiting ?Anesthetic complications: no ? ? ?No notable events documented. ? ?Last Vitals:  ?Vitals:  ? 12/30/21 1310 12/30/21 1325  ?BP: 121/78 116/85  ?Pulse: 88 82  ?Resp: 15 20  ?Temp:  36.7 ?C  ?SpO2: 95% 97%  ?  ?Last Pain:  ?Vitals:  ? 12/30/21 1325  ?PainSc: 0-No pain  ? ? ?  ?  ?  ?  ?  ?  ? ?[place] S ? ? ? ? ?

## 2021-12-30 NOTE — Transfer of Care (Signed)
Immediate Anesthesia Transfer of Care Note ? ?Patient: DAJE STARK ? ?Procedure(s) Performed: MICROLARYNGOSCOPY WITH VOCAL CORD BIOPSY (Left: Throat) ?CO2 LASER APPLICATION (Left: Throat) ? ?Patient Location: PACU ? ?Anesthesia Type:General ? ?Level of Consciousness: awake, alert  and oriented ? ?Airway & Oxygen Therapy: Patient Spontanous Breathing ? ?Post-op Assessment: Report given to RN and Post -op Vital signs reviewed and stable ? ?Post vital signs: Reviewed and stable ? ?Last Vitals:  ?Vitals Value Taken Time  ?BP 122/94 12/30/21 1255  ?Temp    ?Pulse 91 12/30/21 1257  ?Resp 12 12/30/21 1257  ?SpO2 96 % 12/30/21 1257  ?Vitals shown include unvalidated device data. ? ?Last Pain:  ?Vitals:  ? 12/30/21 1022  ?PainSc: 5   ?   ? ?Patients Stated Pain Goal: 0 (12/30/21 1022) ? ?Complications: No notable events documented. ?

## 2021-12-30 NOTE — Anesthesia Preprocedure Evaluation (Signed)
Anesthesia Evaluation  ?Patient identified by MRN, date of birth, ID band ?Patient awake ? ? ? ?Reviewed: ?Allergy & Precautions, H&P , NPO status , Patient's Chart, lab work & pertinent test results ? ?Airway ?Mallampati: II ? ? ?Neck ROM: full ? ? ? Dental ?  ?Pulmonary ?asthma , Current Smoker and Patient abstained from smoking.,  ?  ?breath sounds clear to auscultation ? ? ? ? ? ? Cardiovascular ?negative cardio ROS ? ? ?Rhythm:regular Rate:Normal ? ? ?  ?Neuro/Psych ? Headaches, PSYCHIATRIC DISORDERS Anxiety   ? GI/Hepatic ?  ?Endo/Other  ? ? Renal/GU ?  ? ?  ?Musculoskeletal ? ?(+) Arthritis ,  ? Abdominal ?  ?Peds ? Hematology ?  ?Anesthesia Other Findings ? ? Reproductive/Obstetrics ? ?  ? ? ? ? ? ? ? ? ? ? ? ? ? ?  ?  ? ? ? ? ? ? ? ? ?Anesthesia Physical ?Anesthesia Plan ? ?ASA: 2 ? ?Anesthesia Plan: General  ? ?Post-op Pain Management:   ? ?Induction: Intravenous ? ?PONV Risk Score and Plan: 2 and Ondansetron, Dexamethasone, Midazolam and Treatment may vary due to age or medical condition ? ?Airway Management Planned: Oral ETT ? ?Additional Equipment:  ? ?Intra-op Plan:  ? ?Post-operative Plan: Extubation in OR ? ?Informed Consent: I have reviewed the patients History and Physical, chart, labs and discussed the procedure including the risks, benefits and alternatives for the proposed anesthesia with the patient or authorized representative who has indicated his/her understanding and acceptance.  ? ? ? ?Dental advisory given ? ?Plan Discussed with: CRNA, Anesthesiologist and Surgeon ? ?Anesthesia Plan Comments:   ? ? ? ? ? ? ?Anesthesia Quick Evaluation ? ?

## 2021-12-30 NOTE — H&P (Signed)
Chloe Joyce is an 51 y.o. female.   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 ? ?Past Medical History:  ?Diagnosis Date  ? Ankle injury   ? Anxiety   ? Arthritis   ? Asthma   ? Back pain   ? Headache   ? Hyperlipidemia   ? ? ?Past Surgical History:  ?Procedure Laterality Date  ? ABDOMINAL HYSTERECTOMY    ? CESAREAN SECTION    ? CHOLECYSTECTOMY    ? HEMORROIDECTOMY    ? SALPINGOOPHORECTOMY Bilateral 11/22/2016  ? Procedure: BILATERAL SALPINGO OOPHORECTOMY;  Surgeon: Florian Buff, MD;  Location: [facility];  Service: Gynecology;  Laterality: Bilateral;  ? SHOULDER SURGERY Right   ? TONSILLECTOMY AND ADENOIDECTOMY  1988  ? TONSILLECTOMY AND ADENOIDECTOMY    ? TUBAL LIGATION    ? VAGINAL HYSTERECTOMY N/A 11/22/2016  ? Procedure: HYSTERECTOMY VAGINAL;  Surgeon: Florian Buff, MD;  Location: [facility];  Service: Gynecology;  Laterality: N/A;  ? ? ?Family History  ?Problem Relation Age of Onset  ? Hypertension Mother   ? Cancer Mother   ?     breast  ? Stroke Mother   ? Hyperlipidemia Mother   ? Varicose Veins Mother   ? Miscarriages / [place] Mother   ? Brain cancer Mother   ? Asthma Brother   ? Hypothyroidism Son   ? Diabetes Maternal Grandmother   ? Heart disease Other   ? ?Social History:  reports that she has been smoking cigarettes. She has a 5.00 pack-year smoking history. She has never used smokeless tobacco. She reports current alcohol use. She reports that she does not use drugs. ? ?Allergies: No Known Allergies ? ?Medications Prior to Admission  ?Medication Sig Dispense Refill  ? albuterol (PROVENTIL HFA;VENTOLIN HFA) 108 (90 BASE) MCG/ACT inhaler Inhale 1-2 puffs into the lungs every 6 (six) hours as needed for wheezing or shortness of breath. 1 Inhaler 0  ? ALPRAZolam (XANAX) 1 MG tablet Take 1 mg by mouth 3 (three) times daily as needed for anxiety.    ? budesonide-formoterol (SYMBICORT)  80-4.5 MCG/ACT inhaler Take 2 puffs first thing in am and then another 2 puffs about 12 hours later. 1 each 12  ? cyclobenzaprine (FLEXERIL) 10 MG tablet Take 1 tablet (10 mg total) by mouth 3 (three) times daily as needed for muscle spasms. (Patient taking differently: Take 10 mg by mouth 3 (three) times daily.) 15 tablet 0  ? fenofibrate (TRICOR) 145 MG tablet Take 145 mg by mouth daily.    ? fluticasone (FLONASE) 50 MCG/ACT nasal spray Place 2 sprays daily as needed into both nostrils for allergies or rhinitis.    ? furosemide (LASIX) 20 MG tablet Take 20 mg by mouth daily as needed for fluid.    ? hydrOXYzine (VISTARIL) 50 MG capsule Take 50 mg by mouth daily.    ? loratadine (CLARITIN) 10 MG tablet Take 10 mg daily by mouth.    ? montelukast (SINGULAIR) 10 MG tablet Take 10 mg daily by mouth.    ? simvastatin (ZOCOR) 40 MG tablet Take 40 mg by mouth every evening.     ? theophylline (THEO-24) 300 MG 24 hr capsule Take 300 mg by mouth 3 (three) times daily as needed (Cold/wheezing).    ? traMADol (ULTRAM) 50 MG tablet Take 50 mg by mouth every 6 (six) hours as needed for  pain.    ? traZODone (DESYREL) 100 MG tablet Take 100 mg by mouth at bedtime.    ? ? ?Results for orders placed or performed during the hospital encounter of 12/30/21 (from the past 48 hour(s))  ?SARS Coronavirus 2 by RT PCR (hospital order, performed in [facility] hospital lab) *cepheid single result test* Nasopharyngeal Nasopharyngeal Swab     Status: None  ? Collection Time: 12/30/21  9:59 AM  ? Specimen: Nasopharyngeal Swab  ?Result Value Ref Range  ? SARS Coronavirus 2 by RT PCR NEGATIVE NEGATIVE  ?  Comment: (NOTE) ?SARS-CoV-2 target nucleic acids are NOT DETECTED. ? ?The SARS-CoV-2 RNA is generally detectable in upper and lower ?respiratory specimens during the acute phase of infection. The lowest ?concentration of SARS-CoV-2 viral copies this assay can detect is 250 ?copies / mL. A negative result does not preclude SARS-CoV-2  infection ?and should not be used as the sole basis for treatment or other ?patient management decisions.  A negative result may occur with ?improper specimen collection / handling, submission of specimen other ?than nasopharyngeal swab, presence of viral mutation(s) within the ?areas targeted by this assay, and inadequate number of viral copies ?(<250 copies / mL). A negative result must be combined with clinical ?observations, patient history, and epidemiological information. ? ?Fact Sheet for Patients:   ?https://www.patel.info/ ? ?Fact Sheet for Healthcare Providers: ?https://hall.com/ ? ?This test is not yet approved or  cleared by the [place] FDA and ?has been authorized for detection and/or diagnosis of SARS-CoV-2 by ?FDA under an Emergency Use Authorization (EUA).  This EUA will remain ?in effect (meaning this test can be used) for the duration of the ?COVID-19 declaration under Section 564(b)(1) of the Act, 21 U.S.C. ?section 360bbb-3(b)(1), unless the authorization is terminated or ?revoked sooner. ? ?Performed at [facility], [place] [address], [place], [place] ?[address] ?  ?Basic metabolic panel     Status: None  ? Collection Time: 12/30/21 10:19 AM  ?Result Value Ref Range  ? Sodium 140 135 - 145 mmol/L  ? Potassium 4.2 3.5 - 5.1 mmol/L  ? Chloride 106 98 - 111 mmol/L  ? CO2 23 22 - 32 mmol/L  ? Glucose, Bld 98 70 - 99 mg/dL  ?  Comment: Glucose reference range applies only to samples taken after fasting for at least 8 hours.  ? BUN 8 6 - 20 mg/dL  ? Creatinine, Ser 0.79 0.44 - 1.00 mg/dL  ? Calcium 9.4 8.9 - 10.3 mg/dL  ? GFR, Estimated >60 >60 mL/min  ?  Comment: (NOTE) ?Calculated using the CKD-EPI Creatinine Equation (2021) ?  ? Anion gap 11 5 - 15  ?  Comment: Performed at [facility], [place] [address], [place], [place] [address]  ?CBC     Status: None  ? Collection Time: 12/30/21 10:19 AM  ?Result Value Ref Range  ? WBC 7.8 4.0 - 10.5 K/uL   ? RBC 4.65 3.87 - 5.11 MIL/uL  ? Hemoglobin 13.9 12.0 - 15.0 g/dL  ? HCT 41.8 36.0 - 46.0 %  ? MCV 89.9 80.0 - 100.0 fL  ? MCH 29.9 26.0 - 34.0 pg  ? MCHC 33.3 30.0 - 36.0 g/dL  ? RDW 13.7 11.5 - 15.5 %  ? Platelets 395 150 - 400 K/uL  ? nRBC 0.0 0.0 - 0.2 %  ?  Comment: Performed at [facility], [place] [address], [place], [place] [address]  ? ?No results found. ? ?Review of  Systems  ?Respiratory:  Positive for wheezing.   ?All other systems reviewed and are negative. ? ?Blood pressure 111/76, pulse 90, temperature 98.3 ?F (36.8 ?C), resp. rate 18, height '5\' 6"'$  (1.676 m), weight 86.2 kg, SpO2 98 %. ?Physical Exam ?Constitutional:   ?   Appearance: Normal appearance. She is normal weight.  ?HENT:  ?   Head: Normocephalic and atraumatic.  ?   Right Ear: External ear normal.  ?   Left Ear: External ear normal.  ?   Nose: Nose normal.  ?   Mouth/Throat:  ?   Mouth: Mucous membranes are moist.  ?   Pharynx: Oropharynx is clear.  ?Eyes:  ?   Extraocular Movements: Extraocular movements intact.  ?   Conjunctiva/sclera: Conjunctivae normal.  ?   Pupils: Pupils are equal, round, and reactive to light.  ?Cardiovascular:  ?   Rate and Rhythm: Normal rate.  ?Pulmonary:  ?   Effort: Pulmonary effort is normal.  ?Musculoskeletal:  ?   Cervical back: Normal range of motion.  ?Skin: ?   General: Skin is warm and dry.  ?Neurological:  ?   General: No focal deficit present.  ?   Mental Status: She is alert and oriented to person, place, and time.  ?Psychiatric:     ?   Mood and Affect: Mood normal.     ?   Behavior: Behavior normal.     ?   Thought Content: Thought content normal.     ?   Judgment: Judgment normal.  ?  ? ?Assessment/Plan ?Vocal cord polyp and wheezing ? ?To OR for SMDL with CO2 laser excision. ? ?Melida Quitter, MD ?12/30/2021, 11:52 AM ? ? ? ?

## 2021-12-30 NOTE — Op Note (Signed)
PREOPERATIVE DIAGNOSIS:  Wheezing and left vocal cord polyp ?  ?POSTOPERATIVE DIAGNOSIS:  Wheezing and left vocal cord polyp ?  ?PROCEDURE:  Suspended microdirect laryngoscopy with CO2 laser excision of left vocal fold polyp ?  ?SURGEON:  Melida Quitter, MD ?  ?ANESTHESIA:  General with jet ventilation by anesthesia. ?  ?COMPLICATIONS:  None. ?  ?INDICATIONS:  The patient is a 51 year old female with a history of smoking and wheezing.  She was found to have a left vocal fold polyp that was prominent and flopping through the glottis during respiration.  She presents to the operating room for surgical management. ?  ?FINDINGS:  Prominent left vocal fold polyp ?  ?DESCRIPTION OF PROCEDURE:  The patient was identified in the holding room, informed consent having been obtained including discussion of risks, benefits and alternatives, the patient was brought to the operative suite and put the operative table in the supine position.  Anesthesia was induced and the patient was maintained via mask ventilation.  The eyes were taped closed and bed was turned 90 degrees from anesthesia.  The patient was given intravenous steroids during the ? case.  A tooth guard was placed over the upper teeth and a Stortz laryngoscope was placed into the supraglottic position and suspended to Mayo stand using the Lewy arm.  Jet ventilation was initiated.  Damp eye pads were taped over the eyes and a damp towel was placed over the face.  Photodocumentation was performed with the zero degree telescope.  An epinephrine-soaked pledget was held against the polyp for a minute or so while holding ventilation.  The operating microscope was brought into the field and was used to evaluate the larynx.  The left-sided polyp was grasped with a cup forceps and retracted medially while an incision was made along the natural contour of the vocal fold using the CO2 laser.  Dissection continued at that level until the lesion was removed.  It was passed to  nursing for pathology.  Some of the gelatinous Renke's space tissue was left in place over the vocal ligament.  An epinephrine-soaked pledget was again held against the site for a minute or so.  Photodocumentation was repeated.  The larynx was sprayed with topical lidocaine.  The laryngoscope was then taking out of suspension and removed from the patient's mouth while suctioning the airway.  The tooth guard was removed and the patient was turned back to anesthesia for wakeup and taken to the recovery room in stable condition. ? ?

## 2021-12-30 NOTE — Brief Op Note (Signed)
12/30/2021 ? ?12:41 PM ? ?PATIENT:  Chloe Joyce  51 y.o. female ? ?PRE-OPERATIVE DIAGNOSIS:  Vocal cord polyp ? ?POST-OPERATIVE DIAGNOSIS:  Vocal cord polyp ? ?PROCEDURE:  Procedure(s): ?MICROLARYNGOSCOPY WITH VOCAL CORD BIOPSY (Left) ?CO2 LASER APPLICATION (Left) ? ?SURGEON:  Surgeon(s) and Role: ?   Melida Quitter, MD - Primary ? ?PHYSICIAN ASSISTANT:  ? ?ASSISTANTS: none  ? ?ANESTHESIA:   general ? ?EBL:  Minimal  ? ?BLOOD ADMINISTERED:none ? ?DRAINS: none  ? ?LOCAL MEDICATIONS USED:  NONE ? ?SPECIMEN:  Source of Specimen:  Left vocal fold polyp ? ?DISPOSITION OF SPECIMEN:  PATHOLOGY ? ?COUNTS:  YES ? ?TOURNIQUET:  * No tourniquets in log * ? ?DICTATION: .Note written in EPIC ? ?PLAN OF CARE: Discharge to home after PACU ? ?PATIENT DISPOSITION:  PACU - hemodynamically stable. ?  ?Delay start of Pharmacological VTE agent (>24hrs) due to surgical blood loss or risk of bleeding: no ? ?

## 2021-12-31 ENCOUNTER — Encounter (HOSPITAL_COMMUNITY): Payer: Self-pay | Admitting: Otolaryngology

## 2022-01-02 LAB — SURGICAL PATHOLOGY

## 2022-06-04 ENCOUNTER — Emergency Department (HOSPITAL_COMMUNITY): Payer: Medicaid Other

## 2022-06-04 ENCOUNTER — Other Ambulatory Visit: Payer: Self-pay

## 2022-06-04 ENCOUNTER — Emergency Department (HOSPITAL_COMMUNITY)
Admission: EM | Admit: 2022-06-04 | Discharge: 2022-06-04 | Disposition: A | Discharge status: Home / Self Care | Payer: Medicaid Other | Attending: Emergency Medicine | Admitting: Emergency Medicine

## 2022-06-04 ENCOUNTER — Encounter (HOSPITAL_COMMUNITY): Payer: Self-pay | Admitting: Emergency Medicine

## 2022-06-04 DIAGNOSIS — M79642 Pain in left hand: Secondary | ICD-10-CM | POA: Diagnosis present

## 2022-06-04 MED ORDER — NAPROXEN 500 MG PO TABS: 500.0000 mg | ORAL_TABLET | Freq: Two times a day (BID) | ORAL | 0 refills | Status: DC

## 2022-06-04 MED ORDER — NAPROXEN 250 MG PO TABS
500.0000 mg | ORAL_TABLET | Freq: Once | ORAL | Status: AC
  Administered 2022-06-04: 500 mg via ORAL
  Filled 2022-06-04: qty 2

## 2022-06-04 MED ORDER — HYDROCODONE-ACETAMINOPHEN 5-325 MG PO TABS: 1.0000 | ORAL_TABLET | Freq: Four times a day (QID) | ORAL | 0 refills | Status: DC | PRN

## 2022-06-04 MED ORDER — NAPROXEN 500 MG PO TABS: 500.0000 mg | ORAL_TABLET | Freq: Two times a day (BID) | ORAL | 0 refills | Status: AC

## 2022-06-04 NOTE — ED Notes (Signed)
Instructed pt to not drive or operate machinery after taking Norco for pain due to drowsiness.

## 2022-06-04 NOTE — Discharge Instructions (Addendum)
As discussed your x-rays are negative today meaning your bones and joints look normal and healthy in your hand and wrist.  However given the location of your pain I suspect you have a tendon or ligament strain or sprain.  The treatment for this is for things, the first is immobilizing the joint which the wrist splint should provide, ice is much as possible, resting the joint and the medications prescribed.  Plan follow-up with either your primary orthopedic provider or Dr. Aline Brochure who is our on-call hand specialist today.  If you are interested in obtaining orthopedic care in another location I would refer you to your family doctor who can refer you as appropriate.

## 2022-06-04 NOTE — ED Provider Notes (Addendum)
[facility] EMERGENCY DEPARTMENT Provider Note   CSN: [id number] Arrival date & time: 06/04/22  1129     History  Chief Complaint  Patient presents with   Hand Pain    Chloe Joyce is a 51 y.o. female.  The history is provided by the patient.  Hand Pain This is a new problem. Episode onset: spontaneously started 1 week ago. The problem occurs constantly. The problem has not changed since onset.Pertinent negatives include no chest pain, no abdominal pain, no headaches and no shortness of breath. Associated symptoms comments: Denies numbness in the thumb or hand.  No known injury, overuse, no fall.. Exacerbated by: trying to pick up objects. Nothing relieves the symptoms. Treatments tried: on tramadol for right shoulder djd. no improvement.  Pt is right handed. The treatment provided no relief.       Home Medications Prior to Admission medications   Medication Sig Start Date End Date Taking? Authorizing Provider  HYDROcodone-acetaminophen (NORCO/VICODIN) 5-325 MG tablet Take 1 tablet by mouth every 6 (six) hours as needed. 06/04/22  Yes Kenitha Glendinning, Almyra Free, PA-C  naproxen (NAPROSYN) 500 MG tablet Take 1 tablet (500 mg total) by mouth 2 (two) times daily. 06/04/22  Yes Tametra Ahart, Almyra Free, PA-C  albuterol (PROVENTIL HFA;VENTOLIN HFA) 108 (90 BASE) MCG/ACT inhaler Inhale 1-2 puffs into the lungs every 6 (six) hours as needed for wheezing or shortness of breath. 07/01/14   Nat Christen, MD  ALPRAZolam Duanne Moron) 1 MG tablet Take 1 mg by mouth 3 (three) times daily as needed for anxiety.    [provider]  budesonide-formoterol (SYMBICORT) 80-4.5 MCG/ACT inhaler Take 2 puffs first thing in am and then another 2 puffs about 12 hours later. 11/09/21   Tanda Rockers, MD  cyclobenzaprine (FLEXERIL) 10 MG tablet Take 1 tablet (10 mg total) by mouth 3 (three) times daily as needed for muscle spasms. Patient taking differently: Take 10 mg by mouth 3 (three) times daily. 01/12/18   Veryl Speak, MD   fenofibrate (TRICOR) 145 MG tablet Take 145 mg by mouth daily. 08/29/21   [provider]  fluticasone (FLONASE) 50 MCG/ACT nasal spray Place 2 sprays daily as needed into both nostrils for allergies or rhinitis.    [provider]  furosemide (LASIX) 20 MG tablet Take 20 mg by mouth daily as needed for fluid.    [provider]  hydrOXYzine (VISTARIL) 50 MG capsule Take 50 mg by mouth daily.    [provider]  loratadine (CLARITIN) 10 MG tablet Take 10 mg daily by mouth.    [provider]  montelukast (SINGULAIR) 10 MG tablet Take 10 mg daily by mouth.    [provider]  simvastatin (ZOCOR) 40 MG tablet Take 40 mg by mouth every evening.     [provider]  theophylline (THEO-24) 300 MG 24 hr capsule Take 300 mg by mouth 3 (three) times daily as needed (Cold/wheezing). 07/01/14   [provider]  traZODone (DESYREL) 100 MG tablet Take 100 mg by mouth at bedtime. 10/28/21   [provider]      Allergies    Patient has no known allergies.    Review of Systems   Review of Systems  Constitutional:  Negative for fever.  HENT:  Negative for congestion and sore throat.   Eyes: Negative.   Respiratory:  Negative for chest tightness and shortness of breath.   Cardiovascular:  Negative for chest pain.  Gastrointestinal:  Negative for abdominal pain and nausea.  Genitourinary: Negative.   Musculoskeletal:  Positive for arthralgias. Negative for joint swelling and neck pain.  Skin: Negative.  Negative for rash and wound.  Neurological:  Negative for dizziness, weakness, light-headedness, numbness and headaches.  Psychiatric/Behavioral: Negative.    All other systems reviewed and are negative.   Physical Exam Updated Vital Signs BP 116/82 (BP Location: Right Arm)   Pulse 97   Temp 98.2 F (36.8 C) (Oral)   Resp 15   LMP  (LMP Unknown)   SpO2 100%  Physical Exam Constitutional:      Appearance: She is  well-developed.  HENT:     Head: Atraumatic.  Cardiovascular:     Comments: Pulses equal bilaterally Musculoskeletal:        General: Tenderness present.     Left hand: Tenderness present. Normal sensation.     Cervical back: Normal range of motion.     Comments: Ttp left volar thumb along thenar eminence and along proximal thumb metacarpal.  No edema or erythema.  Pain with resisted ROM.  Radial pulse intact,  less than 2 sec cap refill in finger tips.   Skin:    General: Skin is warm and dry.  Neurological:     Mental Status: She is alert.     Sensory: No sensory deficit.     Motor: No weakness.     Deep Tendon Reflexes: Reflexes normal.     ED Results / Procedures / Treatments   Labs (all labs ordered are listed, but only abnormal results are displayed) Labs Reviewed - No data to display  EKG None  Radiology DG Wrist Complete Left  Result Date: 06/04/2022 CLINICAL DATA:  Acute LEFT wrist pain for 1 week. No known injury. Initial encounter. EXAM: LEFT WRIST - COMPLETE 3+ VIEW COMPARISON:  None Available. FINDINGS: There is no evidence of fracture or dislocation. There is no evidence of arthropathy or other focal bone abnormality. Soft tissues are unremarkable. IMPRESSION: Negative. Electronically Signed   By: Margarette Canada M.D.   On: 06/04/2022 12:09   DG Hand Complete Left  Result Date: 06/04/2022 CLINICAL DATA:  Acute LEFT hand pain for 1 week. No known injury. Initial encounter. EXAM: LEFT HAND - COMPLETE 3+ VIEW COMPARISON:  None Available. FINDINGS: There is no evidence of fracture or dislocation. There is no evidence of arthropathy or other focal bone abnormality. Soft tissues are unremarkable. IMPRESSION: Negative. Electronically Signed   By: Margarette Canada M.D.   On: 06/04/2022 12:08    Procedures Procedures    Medications Ordered in ED Medications  naproxen (NAPROSYN) tablet 500 mg (500 mg Oral Given 06/04/22 1352)    ED Course/ Medical Decision Making/ A&P                            Medical Decision Making Patient with a 1 week history of left thumb pain, no reported overuse or trauma.  She has tenderness with palpation but no other exam findings, specifically no erythema, her skin is intact, no crepitus, no joint edema.  Imaging reviewed and discussed with patient.  No bony or joint abnormalities on plain film imaging.  Suspect soft tissue injury such as a tendinitis.  She was placed in a thumb spica splint, discussed home treatment, medications prescribed including naproxen.  She has been taking tramadol for a right shoulder arthritis, states this medication is not helping, she has been unable to sleep for the past 3 nights secondary to this  thumb pain.  I will prescribe a small quantity of hydrocodone in addition with an anti-inflammatory.  Referral to orthopedics for follow-up care.  Amount and/or Complexity of Data Reviewed Radiology: ordered and independent interpretation performed.    Details: Negative acute findings.   Risk Prescription drug management.           Final Clinical Impression(s) / ED Diagnoses Final diagnoses:  Hand pain, left    Rx / DC Orders ED Discharge Orders          Ordered    HYDROcodone-acetaminophen (NORCO/VICODIN) 5-325 MG tablet  Every 6 hours PRN        06/04/22 1347    naproxen (NAPROSYN) 500 MG tablet  2 times daily        06/04/22 1347              Evalee Jefferson, PA-C 06/04/22 1415    Evalee Jefferson, PA-C 06/04/22 1416    Wyvonnia Dusky, MD 06/04/22 1520

## 2022-06-04 NOTE — ED Triage Notes (Signed)
Pt reports left hand/wrist pain that started 1 week ago. Pt denies injury to the area.

## 2022-06-21 ENCOUNTER — Encounter: Payer: Self-pay | Admitting: Internal Medicine

## 2022-07-21 ENCOUNTER — Other Ambulatory Visit (HOSPITAL_COMMUNITY): Payer: Self-pay | Admitting: Emergency Medicine

## 2022-07-21 ENCOUNTER — Encounter (HOSPITAL_COMMUNITY): Payer: Self-pay | Admitting: Emergency Medicine

## 2022-07-21 DIAGNOSIS — Z1231 Encounter for screening mammogram for malignant neoplasm of breast: Secondary | ICD-10-CM

## 2022-07-25 ENCOUNTER — Other Ambulatory Visit (HOSPITAL_COMMUNITY): Payer: Self-pay | Admitting: Emergency Medicine

## 2022-07-25 DIAGNOSIS — Z1231 Encounter for screening mammogram for malignant neoplasm of breast: Secondary | ICD-10-CM

## 2022-07-26 ENCOUNTER — Encounter: Payer: Self-pay | Admitting: Gastroenterology

## 2022-07-26 ENCOUNTER — Ambulatory Visit (INDEPENDENT_AMBULATORY_CARE_PROVIDER_SITE_OTHER): Payer: Medicaid Other | Admitting: Gastroenterology

## 2022-07-26 ENCOUNTER — Ambulatory Visit: Payer: Medicaid Other | Admitting: Gastroenterology

## 2022-07-26 ENCOUNTER — Encounter: Payer: Self-pay | Admitting: *Deleted

## 2022-07-26 VITALS — BP 109/74 | HR 83 | Temp 97.0°F | Ht 66.0 in | Wt 191.6 lb

## 2022-07-26 DIAGNOSIS — R1031 Right lower quadrant pain: Secondary | ICD-10-CM | POA: Diagnosis not present

## 2022-07-26 DIAGNOSIS — Z800 Family history of malignant neoplasm of digestive organs: Secondary | ICD-10-CM

## 2022-07-26 DIAGNOSIS — G8929 Other chronic pain: Secondary | ICD-10-CM

## 2022-07-26 MED ORDER — PEG 3350-KCL-NA BICARB-NACL 420 G PO SOLR: 4000.0000 mL | Freq: Once | ORAL | 0 refills | Status: AC

## 2022-07-26 NOTE — Progress Notes (Signed)
GI Office Note    Referring Provider: Vidal Schwalbe, MD Primary Care Physician:  Vidal Schwalbe, MD  Primary Gastroenterologist: Elon Alas. Abbey Chatters, DO   Chief Complaint   Chief Complaint  Patient presents with   New Patient (Initial Visit)    Right side abdominal pain     History of Present Illness   Chloe Joyce is a 51 y.o. female presenting today at the request of Dr. Bartolo Darter for further evaluation of right-sided abdominal pain.   Labs from September 2023: White blood cell count 6400, hemoglobin 14.1, platelets 360,000, TSH 4.44, glucose 77, creatinine 0.86, albumin 4.5, total bilirubin 0.3, alk phos 94, AST 13, ALT 20,    Colonoscopy back in her 40s.  BM mostly loose.  Difficult historian.  No melena, brbpr.  No heartburn.  No dysphagia.  Several months, has had pulling sensation in right abdomen with BM. Intermittent. Happening six months. Not sure if related to heavy lifting. Radiates into lower abdomen at times. Has happened lifting.  If upset stomach, 5-6 times imodium  Medications   Current Outpatient Medications  Medication Sig Dispense Refill   albuterol (PROVENTIL HFA;VENTOLIN HFA) 108 (90 BASE) MCG/ACT inhaler Inhale 1-2 puffs into the lungs every 6 (six) hours as needed for wheezing or shortness of breath. 1 Inhaler 0   ALPRAZolam (XANAX) 1 MG tablet Take 1 mg by mouth 3 (three) times daily as needed for anxiety.     budesonide-formoterol (SYMBICORT) 80-4.5 MCG/ACT inhaler Take 2 puffs first thing in am and then another 2 puffs about 12 hours later. 1 each 12   cyclobenzaprine (FLEXERIL) 10 MG tablet Take 1 tablet (10 mg total) by mouth 3 (three) times daily as needed for muscle spasms. (Patient taking differently: Take 10 mg by mouth 3 (three) times daily.) 15 tablet 0   fenofibrate (TRICOR) 145 MG tablet Take 145 mg by mouth daily.     fluticasone (FLONASE) 50 MCG/ACT nasal spray Place 2 sprays daily as needed into both nostrils for allergies or  rhinitis.     furosemide (LASIX) 20 MG tablet Take 20 mg by mouth daily as needed for fluid.     hydrOXYzine (VISTARIL) 50 MG capsule Take 50 mg by mouth daily.     loratadine (CLARITIN) 10 MG tablet Take 10 mg daily by mouth.     montelukast (SINGULAIR) 10 MG tablet Take 10 mg daily by mouth.     naproxen (NAPROSYN) 500 MG tablet Take 1 tablet (500 mg total) by mouth 2 (two) times daily. 20 tablet 0   pantoprazole (PROTONIX) 40 MG tablet Take 40 mg by mouth daily before breakfast.     simvastatin (ZOCOR) 40 MG tablet Take 40 mg by mouth every evening.      traMADol (ULTRAM) 50 MG tablet Take by mouth every 6 (six) hours as needed.     traZODone (DESYREL) 100 MG tablet Take 100 mg by mouth at bedtime.     No current facility-administered medications for this visit.    Allergies   Allergies as of 07/26/2022   (No Known Allergies)    Past Medical History   Past Medical History:  Diagnosis Date   Ankle injury    Anxiety    Arthritis    Asthma    Back pain    Headache    Hyperlipidemia    Hypothyroid     Past Surgical History   Past Surgical History:  Procedure Laterality Date   ABDOMINAL HYSTERECTOMY  CESAREAN SECTION     CHOLECYSTECTOMY     CO2 LASER APPLICATION Left 03/09/9469   Procedure: CO2 LASER APPLICATION;  Surgeon: Melida Quitter, MD;  Location: [place];  Service: ENT;  Laterality: Left;   HEMORROIDECTOMY     MICROLARYNGOSCOPY Left 12/30/2021   Procedure: MICROLARYNGOSCOPY WITH VOCAL CORD BIOPSY;  Surgeon: Melida Quitter, MD;  Location: [place];  Service: ENT;  Laterality: Left;   SALPINGOOPHORECTOMY Bilateral 11/22/2016   Procedure: BILATERAL SALPINGO OOPHORECTOMY;  Surgeon: Florian Buff, MD;  Location: [facility];  Service: Gynecology;  Laterality: Bilateral;   SHOULDER SURGERY Right    TONSILLECTOMY AND ADENOIDECTOMY  1988   TONSILLECTOMY AND ADENOIDECTOMY     TUBAL LIGATION     VAGINAL HYSTERECTOMY N/A 11/22/2016   Procedure: HYSTERECTOMY VAGINAL;  Surgeon: Florian Buff, MD;  Location: [facility];  Service: Gynecology;  Laterality: N/A;    Past Family History   Family History  Problem Relation Age of Onset   Hypertension Mother    Cancer Mother        breast   Stroke Mother    Hyperlipidemia Mother    Varicose Veins Mother    Miscarriages / Stillbirths Mother    Brain cancer Mother    Cancer Father 35       colon cancer on death certificate but also pt also describes prostate ca tx   Asthma Brother    Diabetes Maternal Grandmother    Hypothyroidism Son    Heart disease Other     Past Social History   Social History   Socioeconomic History   Marital status: Single    Spouse name: Not on file   Number of children: Not on file   Years of education: Not on file   Highest education level: Not on file  Occupational History   Not on file  Tobacco Use   Smoking status: Every Day    Packs/day: 0.25    Years: 20.00    Total pack years: 5.00    Types: Cigarettes   Smokeless tobacco: Never  Substance and Sexual Activity   Alcohol use: Yes    Comment: occ   Drug use: No   Sexual activity: Yes    Birth control/protection: Surgical    Comment: tubal, hysterectomy  Other Topics Concern   Not on file  Social History Narrative   Not on file   Social Determinants of Health   Financial Resource Strain: Not on file  Food Insecurity: Not on file  Transportation Needs: Not on file  Physical Activity: Not on file  Stress: Not on file  Social Connections: Not on file  Intimate Partner Violence: Not on file    Review of Systems   General: Negative for anorexia, weight loss, fever, chills, fatigue, weakness. Eyes: Negative for vision changes.  ENT: Negative for hoarseness, difficulty swallowing , nasal congestion. CV: Negative for chest pain, angina, palpitations, dyspnea on exertion, peripheral edema.  Respiratory: Negative for dyspnea at rest, dyspnea on exertion, cough, sputum, wheezing.  GI: See history of present illness. GU:   Negative for dysuria, hematuria, urinary incontinence, urinary frequency, nocturnal urination.  MS: Negative for joint pain, low back pain.  Derm: Negative for rash or itching.  Neuro: Negative for weakness, abnormal sensation, seizure, frequent headaches, memory loss,  confusion.  Psych: Negative for anxiety, depression, suicidal ideation, hallucinations.  Endo: Negative for unusual weight change.  Heme: Negative for bruising or bleeding. Allergy: Negative for rash or hives.  Physical Exam  BP 109/74   Pulse 83   Temp (!) 97 F (36.1 C)   Ht 5' 6" (1.676 m)   Wt 191 lb 9.6 oz (86.9 kg)   LMP  (LMP Unknown)   BMI 30.93 kg/m    General: Well-nourished, well-developed in no acute distress.  Head: Normocephalic, atraumatic.   Eyes: Conjunctiva pink, no icterus. Mouth: Oropharyngeal mucosa moist and pink , no lesions erythema or exudate. Neck: Supple without thyromegaly, masses, or lymphadenopathy.  Lungs: Clear to auscultation bilaterally.  Heart: Regular rate and rhythm, no murmurs rubs or gallops.  Abdomen: Bowel sounds are normal, nontender, nondistended, no hepatosplenomegaly or masses,  no abdominal bruits or hernia, no rebound or guarding.   Rectal: *** Extremities: No lower extremity edema. No clubbing or deformities.  Neuro: Alert and oriented x 4 , grossly normal neurologically.  Skin: Warm and dry, no rash or jaundice.   Psych: Alert and cooperative, normal mood and affect.  Labs   *** Imaging Studies   No results found.  Assessment       PLAN   ***   Laureen Ochs. Bobby Rumpf, [place], [facility]

## 2022-07-26 NOTE — H&P (View-Only) (Signed)
GI Office Note    Referring Provider: Vidal Schwalbe, MD Primary Care Physician:  Vidal Schwalbe, MD  Primary Gastroenterologist: Elon Alas. Abbey Chatters, DO   Chief Complaint   Chief Complaint  Patient presents with   New Patient (Initial Visit)    Right side abdominal pain     History of Present Illness   Chloe Joyce is a 51 y.o. female presenting today at the request of Dr. Bartolo Darter for further evaluation of right-sided abdominal pain, colon cancer screening.  Several month history of pulling sensation in the right abdomen. Symptoms mild in intensity. Intermittent. Sometimes worse with heavy lifting. She thought she may have a hernia or pulled muscle. Sometimes worse with BMs. Chronically BM mostly loose 1-2 daily. If her stomach is upset, typically has 5-6 stools per day. No recent changes. Takes imodium which helps. No melena, brbpr. No heartburn, dysphagia. No N/V. Father had colon cancer in his 42s. Patient with chronic back pain.   Labs from September 2023: White blood cell count 6400, hemoglobin 14.1, platelets 360,000, TSH 4.44, glucose 77, creatinine 0.86, albumin 4.5, total bilirubin 0.3, alk phos 94, AST 13, ALT 20.  Medications   Current Outpatient Medications  Medication Sig Dispense Refill   albuterol (PROVENTIL HFA;VENTOLIN HFA) 108 (90 BASE) MCG/ACT inhaler Inhale 1-2 puffs into the lungs every 6 (six) hours as needed for wheezing or shortness of breath. 1 Inhaler 0   ALPRAZolam (XANAX) 1 MG tablet Take 1 mg by mouth 3 (three) times daily as needed for anxiety.     budesonide-formoterol (SYMBICORT) 80-4.5 MCG/ACT inhaler Take 2 puffs first thing in am and then another 2 puffs about 12 hours later. 1 each 12   cyclobenzaprine (FLEXERIL) 10 MG tablet Take 1 tablet (10 mg total) by mouth 3 (three) times daily as needed for muscle spasms. (Patient taking differently: Take 10 mg by mouth 3 (three) times daily.) 15 tablet 0   fenofibrate (TRICOR) 145 MG tablet Take 145 mg  by mouth daily.     fluticasone (FLONASE) 50 MCG/ACT nasal spray Place 2 sprays daily as needed into both nostrils for allergies or rhinitis.     furosemide (LASIX) 20 MG tablet Take 20 mg by mouth daily as needed for fluid.     hydrOXYzine (VISTARIL) 50 MG capsule Take 50 mg by mouth daily.     loratadine (CLARITIN) 10 MG tablet Take 10 mg daily by mouth.     montelukast (SINGULAIR) 10 MG tablet Take 10 mg daily by mouth.     naproxen (NAPROSYN) 500 MG tablet Take 1 tablet (500 mg total) by mouth 2 (two) times daily. 20 tablet 0   pantoprazole (PROTONIX) 40 MG tablet Take 40 mg by mouth daily before breakfast.     simvastatin (ZOCOR) 40 MG tablet Take 40 mg by mouth every evening.      traMADol (ULTRAM) 50 MG tablet Take by mouth every 6 (six) hours as needed.     traZODone (DESYREL) 100 MG tablet Take 100 mg by mouth at bedtime.     No current facility-administered medications for this visit.    Allergies   Allergies as of 07/26/2022   (No Known Allergies)    Past Medical History   Past Medical History:  Diagnosis Date   Ankle injury    Anxiety    Arthritis    Asthma    Back pain    Headache    Hyperlipidemia    Hypothyroid  Past Surgical History   Past Surgical History:  Procedure Laterality Date   ABDOMINAL HYSTERECTOMY     CESAREAN SECTION     CHOLECYSTECTOMY     CO2 LASER APPLICATION Left 5/00/3704   Procedure: CO2 LASER APPLICATION;  Surgeon: Melida Quitter, MD;  Location: [place];  Service: ENT;  Laterality: Left;   HEMORROIDECTOMY     MICROLARYNGOSCOPY Left 12/30/2021   Procedure: MICROLARYNGOSCOPY WITH VOCAL CORD BIOPSY;  Surgeon: Melida Quitter, MD;  Location: [place];  Service: ENT;  Laterality: Left;   SALPINGOOPHORECTOMY Bilateral 11/22/2016   Procedure: BILATERAL SALPINGO OOPHORECTOMY;  Surgeon: Florian Buff, MD;  Location: [facility];  Service: Gynecology;  Laterality: Bilateral;   SHOULDER SURGERY Right    TONSILLECTOMY AND ADENOIDECTOMY  1988    TONSILLECTOMY AND ADENOIDECTOMY     TUBAL LIGATION     VAGINAL HYSTERECTOMY N/A 11/22/2016   Procedure: HYSTERECTOMY VAGINAL;  Surgeon: Florian Buff, MD;  Location: [facility];  Service: Gynecology;  Laterality: N/A;    Past Family History   Family History  Problem Relation Age of Onset   Hypertension Mother    Cancer Mother        breast   Stroke Mother    Hyperlipidemia Mother    Varicose Veins Mother    Miscarriages / Stillbirths Mother    Brain cancer Mother    Cancer Father 13       colon cancer on death certificate but also pt also describes prostate ca tx   Asthma Brother    Diabetes Maternal Grandmother    Hypothyroidism Son    Heart disease Other     Past Social History   Social History   Socioeconomic History   Marital status: Single    Spouse name: Not on file   Number of children: Not on file   Years of education: Not on file   Highest education level: Not on file  Occupational History   Not on file  Tobacco Use   Smoking status: Every Day    Packs/day: 0.25    Years: 20.00    Total pack years: 5.00    Types: Cigarettes   Smokeless tobacco: Never  Substance and Sexual Activity   Alcohol use: Yes    Comment: occ   Drug use: No   Sexual activity: Yes    Birth control/protection: Surgical    Comment: tubal, hysterectomy  Other Topics Concern   Not on file  Social History Narrative   Not on file   Social Determinants of Health   Financial Resource Strain: Not on file  Food Insecurity: Not on file  Transportation Needs: Not on file  Physical Activity: Not on file  Stress: Not on file  Social Connections: Not on file  Intimate Partner Violence: Not on file    Review of Systems   General: Negative for anorexia, weight loss, fever, chills, fatigue, weakness. Eyes: Negative for vision changes.  ENT: Negative for hoarseness, difficulty swallowing , nasal congestion. CV: Negative for chest pain, angina, palpitations, dyspnea on exertion, peripheral  edema.  Respiratory: Negative for dyspnea at rest, dyspnea on exertion, cough, sputum, wheezing.  GI: See history of present illness. GU:  Negative for dysuria, hematuria, urinary incontinence, urinary frequency, nocturnal urination.  MS: Negative for joint pain, low back pain.  Derm: Negative for rash or itching.  Neuro: Negative for weakness, abnormal sensation, seizure, frequent headaches, memory loss,  confusion.  Psych: Negative for anxiety, depression, suicidal ideation, hallucinations.  Endo: Negative for  unusual weight change.  Heme: Negative for bruising or bleeding. Allergy: Negative for rash or hives.  Physical Exam   BP 109/74   Pulse 83   Temp (!) 97 F (36.1 C)   Ht '5\' 6"'$  (1.676 m)   Wt 191 lb 9.6 oz (86.9 kg)   LMP  (LMP Unknown)   BMI 30.93 kg/m    General: Well-nourished, well-developed in no acute distress.  Head: Normocephalic, atraumatic.   Eyes: Conjunctiva pink, no icterus. Mouth: Oropharyngeal mucosa moist and pink , no lesions erythema or exudate. Neck: Supple without thyromegaly, masses, or lymphadenopathy.  Lungs: Clear to auscultation bilaterally.  Heart: Regular rate and rhythm, no murmurs rubs or gallops.  Abdomen: Bowel sounds are normal, nontender, nondistended, no hepatosplenomegaly or masses,  no abdominal bruits or hernia, no rebound or guarding.   Rectal: not performed Extremities: No lower extremity edema. No clubbing or deformities.  Neuro: Alert and oriented x 4 , grossly normal neurologically.  Skin: Warm and dry, no rash or jaundice.   Psych: Alert and cooperative, normal mood and affect.  Labs   See hpi  Imaging Studies   No results found.  Assessment   Colon cancer screening: no prior colonoscopy. She desires colonoscopy. Of note, she has chronic loose stools for years, with no recent change in bowels. Possible IBS.   Right lower abdominal pain: describes a pulling sensation worse with heavy lifting but associates some with  BMs. ?adhesions. No obvious hernia on exam. Unlikely due to IBD, malignancy. Clinically not consistent with appendicitis. Benign exam today.    PLAN   Colonoscopy with Dr. Abbey Chatters. ASA 2.  I have discussed the risks, alternatives, benefits with regards to but not limited to the risk of reaction to medication, bleeding, infection, perforation and the patient is agreeable to proceed. Written consent to be obtained. If colonoscopy unremarkable, consider CT to evaluate her intermittent RLQ pain.    Laureen Ochs. Bobby Rumpf, [place], [facility]

## 2022-07-26 NOTE — Patient Instructions (Signed)
Colonoscopy to be scheduled. See separate instructions.  ?

## 2022-07-31 ENCOUNTER — Ambulatory Visit (HOSPITAL_COMMUNITY)
Admission: RE | Admit: 2022-07-31 | Discharge: 2022-07-31 | Disposition: A | Discharge status: Home / Self Care | Payer: Medicaid Other | Source: Ambulatory Visit | Attending: Emergency Medicine | Admitting: Emergency Medicine

## 2022-07-31 DIAGNOSIS — Z1231 Encounter for screening mammogram for malignant neoplasm of breast: Secondary | ICD-10-CM | POA: Insufficient documentation

## 2022-08-22 ENCOUNTER — Other Ambulatory Visit (HOSPITAL_COMMUNITY)
Admission: RE | Admit: 2022-08-22 | Discharge: 2022-08-22 | Disposition: A | Discharge status: Home / Self Care | Payer: Medicaid Other | Source: Ambulatory Visit | Attending: Internal Medicine | Admitting: Internal Medicine

## 2022-08-22 DIAGNOSIS — R1031 Right lower quadrant pain: Secondary | ICD-10-CM | POA: Diagnosis present

## 2022-08-22 DIAGNOSIS — G8929 Other chronic pain: Secondary | ICD-10-CM | POA: Insufficient documentation

## 2022-08-22 LAB — BASIC METABOLIC PANEL
Anion gap: 10 (ref 5–15)
BUN: 7 mg/dL (ref 6–20)
CO2: 24 mmol/L (ref 22–32)
Calcium: 9.1 mg/dL (ref 8.9–10.3)
Chloride: 102 mmol/L (ref 98–111)
Creatinine, Ser: 0.76 mg/dL (ref 0.44–1.00)
GFR, Estimated: >60 mL/min (ref >60)
Glucose, Bld: 100 mg/dL — ABNORMAL HIGH (ref 70–99)
Potassium: 4 mmol/L (ref 3.5–5.1)
Sodium: 136 mmol/L (ref 135–145)

## 2022-08-25 ENCOUNTER — Ambulatory Visit (HOSPITAL_COMMUNITY): Payer: Medicaid Other | Admitting: Anesthesiology

## 2022-08-25 ENCOUNTER — Encounter (HOSPITAL_COMMUNITY): Payer: Self-pay

## 2022-08-25 ENCOUNTER — Encounter (HOSPITAL_COMMUNITY)
Admission: RE | Disposition: A | Discharge status: Home / Self Care | Payer: Self-pay | Source: Home / Self Care | Attending: Internal Medicine

## 2022-08-25 ENCOUNTER — Other Ambulatory Visit: Payer: Self-pay

## 2022-08-25 ENCOUNTER — Ambulatory Visit (HOSPITAL_COMMUNITY)
Admission: RE | Admit: 2022-08-25 | Discharge: 2022-08-25 | Disposition: A | Discharge status: Home / Self Care | Payer: Medicaid Other | Attending: Internal Medicine | Admitting: Internal Medicine

## 2022-08-25 DIAGNOSIS — Z1211 Encounter for screening for malignant neoplasm of colon: Secondary | ICD-10-CM

## 2022-08-25 DIAGNOSIS — D125 Benign neoplasm of sigmoid colon: Secondary | ICD-10-CM | POA: Diagnosis not present

## 2022-08-25 DIAGNOSIS — F17210 Nicotine dependence, cigarettes, uncomplicated: Secondary | ICD-10-CM | POA: Diagnosis not present

## 2022-08-25 DIAGNOSIS — K648 Other hemorrhoids: Secondary | ICD-10-CM | POA: Insufficient documentation

## 2022-08-25 DIAGNOSIS — Z800 Family history of malignant neoplasm of digestive organs: Secondary | ICD-10-CM | POA: Insufficient documentation

## 2022-08-25 DIAGNOSIS — J45909 Unspecified asthma, uncomplicated: Secondary | ICD-10-CM | POA: Diagnosis not present

## 2022-08-25 DIAGNOSIS — K219 Gastro-esophageal reflux disease without esophagitis: Secondary | ICD-10-CM | POA: Insufficient documentation

## 2022-08-25 DIAGNOSIS — G8929 Other chronic pain: Secondary | ICD-10-CM

## 2022-08-25 DIAGNOSIS — E039 Hypothyroidism, unspecified: Secondary | ICD-10-CM | POA: Diagnosis not present

## 2022-08-25 DIAGNOSIS — K635 Polyp of colon: Secondary | ICD-10-CM | POA: Insufficient documentation

## 2022-08-25 HISTORY — PX: POLYPECTOMY: SHX149

## 2022-08-25 HISTORY — PX: COLONOSCOPY WITH PROPOFOL: SHX5780

## 2022-08-25 SURGERY — COLONOSCOPY WITH PROPOFOL
Anesthesia: General

## 2022-08-25 MED ORDER — PROPOFOL 500 MG/50ML IV EMUL
INTRAVENOUS | Status: DC | PRN
  Administered 2022-08-25: 200 ug/kg/min via INTRAVENOUS

## 2022-08-25 MED ORDER — LACTATED RINGERS IV SOLN: INTRAVENOUS | Status: DC

## 2022-08-25 MED ORDER — PROPOFOL 10 MG/ML IV BOLUS
INTRAVENOUS | Status: DC | PRN
  Administered 2022-08-25: 120 mg via INTRAVENOUS

## 2022-08-25 MED ORDER — LIDOCAINE HCL (CARDIAC) PF 100 MG/5ML IV SOSY
PREFILLED_SYRINGE | INTRAVENOUS | Status: DC | PRN
  Administered 2022-08-25: 50 mg via INTRATRACHEAL

## 2022-08-25 NOTE — Interval H&P Note (Signed)
History and Physical Interval Note:  08/25/2022 9:33 AM  Chloe Joyce  has presented today for surgery, with the diagnosis of right sided abdominal pain, FHX: colon cancer.  The various methods of treatment have been discussed with the patient and family. After consideration of risks, benefits and other options for treatment, the patient has consented to  Procedure(s) with comments: COLONOSCOPY WITH PROPOFOL (N/A) - 9:30 am as a surgical intervention.  The patient's history has been reviewed, patient examined, no change in status, stable for surgery.  I have reviewed the patient's chart and labs.  Questions were answered to the patient's satisfaction.     Eloise Harman

## 2022-08-25 NOTE — Op Note (Signed)
[facility] Patient Name: Chloe Joyce Procedure Date: 08/25/2022 9:32 AM MRN: [id number] Date of Birth: [birth year] Attending MD: Elon Alas. Abbey Chatters , [place], [id number] CSN: [id number] Age: 52 Admit Type: Outpatient Procedure:                Colonoscopy Indications:              Screening for colorectal malignant neoplasm Providers:                Elon Alas. Abbey Chatters, DO, Crystal Page, Wynonia Musty Tech, Technician Referring MD:              Medicines:                See the Anesthesia note for documentation of the                            administered medications Complications:            No immediate complications. Estimated Blood Loss:     Estimated blood loss was minimal. Procedure:                Pre-Anesthesia Assessment:                           - The anesthesia plan was to use monitored                            anesthesia care (MAC).                           After obtaining informed consent, the colonoscope                            was passed under direct vision. Throughout the                            procedure, the patient's blood pressure, pulse, and                            oxygen saturations were monitored continuously. The                            [id number]-HQ190L ([id number]) scope was introduced through                            the anus and advanced to the the terminal ileum,                            with identification of the appendiceal orifice and                            IC valve. The colonoscopy was performed without                            difficulty. The patient tolerated  the procedure                            well. The quality of the bowel preparation was                            evaluated using the BBPS [facility] Bowel Preparation                            Scale) with scores of: Right Colon = 2 (minor                            amount of residual staining, small fragments of                            stool and/or  opaque liquid, but mucosa seen well),                            Transverse Colon = 3 (entire mucosa seen well with                            no residual staining, small fragments of stool or                            opaque liquid) and Left Colon = 3 (entire mucosa                            seen well with no residual staining, small                            fragments of stool or opaque liquid). The total                            BBPS score equals 8. The quality of the bowel                            preparation was good. Scope In: 9:46:31 AM Scope Out: 10:01:27 AM Scope Withdrawal Time: 0 hours 12 minutes 22 seconds  Total Procedure Duration: 0 hours 14 minutes 56 seconds  Findings:      The perianal and digital rectal examinations were normal.      Non-bleeding internal hemorrhoids were found during endoscopy.      A 7 mm polyp was found in the sigmoid colon. The polyp was sessile. The       polyp was removed with a cold snare. Resection and retrieval were       complete.      The terminal ileum appeared normal.      The exam was otherwise without abnormality. Impression:               - Non-bleeding internal hemorrhoids.                           - One 7 mm polyp in the sigmoid colon, removed with  a cold snare. Resected and retrieved.                           - The examined portion of the ileum was normal.                           - The examination was otherwise normal. Moderate Sedation:      Per Anesthesia Care Recommendation:           - Patient has a contact number available for                            emergencies. The signs and symptoms of potential                            delayed complications were discussed with the                            patient. Return to normal activities tomorrow.                            Written discharge instructions were provided to the                            patient.                           -  Resume previous diet.                           - Continue present medications.                           - Await pathology results.                           - Repeat colonoscopy in 7-10 years for surveillance.                           - Return to GI clinic in 3 months. Procedure Code(s):        --- Professional ---                           [phone], Colonoscopy, flexible; with removal of                            tumor(s), polyp(s), or other lesion(s) by snare                            technique Diagnosis Code(s):        --- Professional ---                           Z12.11, Encounter for screening for malignant  neoplasm of colon                           D12.5, Benign neoplasm of sigmoid colon                           K64.8, Other hemorrhoids CPT copyright 2022 American [employer] Association. All rights reserved. The codes documented in this report are preliminary and upon coder review may  be revised to meet current compliance requirements. Elon Alas. Abbey Chatters, DO [place] Abbey Chatters, DO 08/25/2022 10:06:23 AM This report has been signed electronically. Number of Addenda: 0

## 2022-08-25 NOTE — Transfer of Care (Signed)
Immediate Anesthesia Transfer of Care Note  Patient: Chloe Joyce  Procedure(s) Performed: COLONOSCOPY WITH PROPOFOL POLYPECTOMY INTESTINAL  Patient Location: Endoscopy Unit  Anesthesia Type:General  Level of Consciousness: awake, alert , oriented, and patient cooperative  Airway & Oxygen Therapy: Patient Spontanous Breathing  Post-op Assessment: Report given to RN, Post -op Vital signs reviewed and stable, and Patient moving all extremities  Post vital signs: Reviewed and stable  Last Vitals:  Vitals Value Taken Time  BP    Temp 36.4 C 08/25/22 1003  Pulse 75 08/25/22 1003  Resp 16 08/25/22 1003  SpO2 97 % 08/25/22 1003    Last Pain:  Vitals:   08/25/22 1003  TempSrc: Axillary  PainSc:       Patients Stated Pain Goal: 8 (03/47/42 5956)  Complications: No notable events documented.

## 2022-08-25 NOTE — Anesthesia Preprocedure Evaluation (Addendum)
Anesthesia Evaluation  Patient identified by MRN, date of birth, ID band Patient awake    Reviewed: Allergy & Precautions, H&P , NPO status , Patient's Chart, lab work & pertinent test results  Airway Mallampati: III  TM Distance: >3 FB Neck ROM: Full    Dental  (+) Dental Advisory Given, Missing,    Pulmonary asthma , Current Smoker and Patient abstained from smoking.   Pulmonary exam normal breath sounds clear to auscultation       Cardiovascular negative cardio ROS Normal cardiovascular exam Rhythm:Regular Rate:Normal     Neuro/Psych  Headaches PSYCHIATRIC DISORDERS Anxiety        GI/Hepatic Neg liver ROS,GERD  Medicated and Controlled,,  Endo/Other  Hypothyroidism    Renal/GU negative Renal ROS  negative genitourinary   Musculoskeletal  (+) Arthritis , Osteoarthritis,    Abdominal   Peds negative pediatric ROS (+)  Hematology negative hematology ROS (+)   Anesthesia Other Findings Right shoulder pain, back pain   Reproductive/Obstetrics negative OB ROS                             Anesthesia Physical Anesthesia Plan  ASA: 2  Anesthesia Plan: General   Post-op Pain Management: Minimal or no pain anticipated   Induction: Intravenous  PONV Risk Score and Plan: 1 and Propofol infusion  Airway Management Planned: Nasal Cannula and Natural Airway  Additional Equipment:   Intra-op Plan:   Post-operative Plan:   Informed Consent: I have reviewed the patients History and Physical, chart, labs and discussed the procedure including the risks, benefits and alternatives for the proposed anesthesia with the patient or authorized representative who has indicated his/her understanding and acceptance.     Dental advisory given  Plan Discussed with: CRNA and Surgeon  Anesthesia Plan Comments:        Anesthesia Quick Evaluation

## 2022-08-25 NOTE — Discharge Instructions (Addendum)
  Colonoscopy Discharge Instructions  Read the instructions outlined below and refer to this sheet in the next few weeks. These discharge instructions provide you with general information on caring for yourself after you leave the hospital. Your doctor may also give you specific instructions. While your treatment has been planned according to the most current medical practices available, unavoidable complications occasionally occur.   ACTIVITY You may resume your regular activity, but move at a slower pace for the next 24 hours.  Take frequent rest periods for the next 24 hours.  Walking will help get rid of the air and reduce the bloated feeling in your belly (abdomen).  No driving for 24 hours (because of the medicine (anesthesia) used during the test).   Do not sign any important legal documents or operate any machinery for 24 hours (because of the anesthesia used during the test).  NUTRITION Drink plenty of fluids.  You may resume your normal diet as instructed by your doctor.  Begin with a light meal and progress to your normal diet. Heavy or fried foods are harder to digest and may make you feel sick to your stomach (nauseated).  Avoid alcoholic beverages for 24 hours or as instructed.  MEDICATIONS You may resume your normal medications unless your doctor tells you otherwise.  WHAT YOU CAN EXPECT TODAY Some feelings of bloating in the abdomen.  Passage of more gas than usual.  Spotting of blood in your stool or on the toilet paper.  IF YOU HAD POLYPS REMOVED DURING THE COLONOSCOPY: No aspirin products for 7 days or as instructed.  No alcohol for 7 days or as instructed.  Eat a soft diet for the next 24 hours.  FINDING OUT THE RESULTS OF YOUR TEST Not all test results are available during your visit. If your test results are not back during the visit, make an appointment with your caregiver to find out the results. Do not assume everything is normal if you have not heard from your  caregiver or the medical facility. It is important for you to follow up on all of your test results.  SEEK IMMEDIATE MEDICAL ATTENTION IF: You have more than a spotting of blood in your stool.  Your belly is swollen (abdominal distention).  You are nauseated or vomiting.  You have a temperature over 101.  You have abdominal pain or discomfort that is severe or gets worse throughout the day.   Overall, your colon looked very healthy.  I did not see any active inflammation indicative of underlying inflammatory bowel disease such as Crohn's disease or ulcerative colitis.  The end portion of your small bowel called the terminal ileum also appeared normal.  You did have 1 polyp which I removed successfully.  Await pathology results, office will contact you.  Repeat colonoscopy 7 to 10 years depending on pathology results.  You also have internal hemorrhoids.  Follow-up with GI in 3 months.  I hope you have a great rest of your week!  Elon Alas. Abbey Chatters, D.O. G[facility]enterology and [facility]at[facility]

## 2022-08-25 NOTE — Anesthesia Postprocedure Evaluation (Signed)
Anesthesia Post Note  Patient: AMICA HARRON  Procedure(s) Performed: COLONOSCOPY WITH PROPOFOL POLYPECTOMY INTESTINAL  Patient location during evaluation: Phase II Anesthesia Type: General Level of consciousness: awake and alert and oriented Pain management: pain level controlled Vital Signs Assessment: post-procedure vital signs reviewed and stable Respiratory status: spontaneous breathing, nonlabored ventilation and respiratory function stable Cardiovascular status: blood pressure returned to baseline and stable Postop Assessment: no apparent nausea or vomiting Anesthetic complications: no  No notable events documented.   Last Vitals:  Vitals:   08/25/22 0834 08/25/22 1003  BP: (!) 102/91 (!) 98/56  Pulse: 85 75  Resp: 13 16  Temp: 36.6 C 36.4 C  SpO2: 96% 97%    Last Pain:  Vitals:   08/25/22 1003  TempSrc: Axillary  PainSc: 0-No pain                 Thomasena Vandenheuvel C Gessica Jawad

## 2022-08-28 LAB — SURGICAL PATHOLOGY

## 2022-08-30 ENCOUNTER — Encounter (HOSPITAL_COMMUNITY): Payer: Self-pay | Admitting: Internal Medicine

## 2022-10-05 ENCOUNTER — Other Ambulatory Visit (HOSPITAL_COMMUNITY): Payer: Self-pay | Admitting: Physical Medicine & Rehabilitation

## 2022-10-05 DIAGNOSIS — R29818 Other symptoms and signs involving the nervous system: Secondary | ICD-10-CM

## 2022-10-05 DIAGNOSIS — M47816 Spondylosis without myelopathy or radiculopathy, lumbar region: Secondary | ICD-10-CM

## 2022-11-01 ENCOUNTER — Ambulatory Visit: Payer: Medicaid Other | Admitting: Internal Medicine

## 2022-11-01 NOTE — Progress Notes (Deleted)
Chloe Joyce, female    DOB: 31-Mar-1971,    MRN: [id number]   Brief patient profile:  57  yowf active smoker  "asthma since childhood" self- referred to pulmonary clinic in [place] 11/09/2021 for AB and wants to be cleared for R shoulder surgery.      History of Present Illness  11/09/2021  Pulmonary/ 1st office eval/ [facility] / [place] [facility] maint on singulair and theophylline Chief Complaint  Patient presents with   Consult    Consult for asthma   Dyspnea: with   housework/ can barely walk across parking lot/ leans buggy to do grocery Cough: variable but some worse in am and hs  Sleep: needs inhaler at least once each noct  SABA use: qid albuterol  Rec .Plan A = Automatic = Always=    Symbicort 80 Take 2 puffs first thing in am and then another 2 puffs about 12 hours later and within a week you should be able to stop the theophylline permanently  Work on inhaler technique:   Plan B = Backup (to supplement plan A, not to replace it) Only use your albuterol inhaler as a rescue medication Plan C = Crisis (instead of Plan B but only if Plan B stops working) - only use your albuterol nebulizer if you first try Plan B and it fails to help  Please remember to go to the lab department   for your tests - we will call you with the results when they are available.    I will be referring you for ENT evaluation   Please schedule a follow up office visit in 4 weeks, call sooner if needed with all medications /inhalers/ solutions in hand  11/09/2021  :  Allergy screen  Eos 0.6  IgE 830    alpha one AT phenotype  MM  Level 136    11/01/2022  f/u ov/[place] office/[facility]ander Ake re: *** maint on ***  No chief complaint on file.   Dyspnea:  *** Cough: *** Sleeping: *** SABA use: *** 02: *** Covid status: *** Lung cancer screening: ***   No obvious day to day or daytime variability or assoc excess/ purulent sputum or mucus plugs or hemoptysis or cp or chest tightness, subjective wheeze or  overt sinus or hb symptoms.   *** without nocturnal  or early am exacerbation  of respiratory  c/o's or need for noct saba. Also denies any obvious fluctuation of symptoms with weather or environmental changes or other aggravating or alleviating factors except as outlined above   No unusual exposure hx or h/o childhood pna/ asthma or knowledge of premature birth.  Current Allergies, Complete Past Medical History, Past Surgical History, Family History, and Social History were reviewed in [employer] record.  ROS  The following are not active complaints unless bolded Hoarseness, sore throat, dysphagia, dental problems, itching, sneezing,  nasal congestion or discharge of excess mucus or purulent secretions, ear ache,   fever, chills, sweats, unintended wt loss or wt gain, classically pleuritic or exertional cp,  orthopnea pnd or arm/hand swelling  or leg swelling, presyncope, palpitations, abdominal pain, anorexia, nausea, vomiting, diarrhea  or change in bowel habits or change in bladder habits, change in stools or change in urine, dysuria, hematuria,  rash, arthralgias, visual complaints, headache, numbness, weakness or ataxia or problems with walking or coordination,  change in mood or  memory.        No outpatient medications have been marked as taking for the 11/01/22 encounter (  Appointment) with Tanda Rockers, MD.                   Past Medical History:  Diagnosis Date   Ankle injury    Anxiety    Arthritis    Asthma    Back pain    Headache    Hyperlipidemia         Objective:     Wt Readings from Last 3 Encounters:  08/25/22 180 lb (81.6 kg)  07/26/22 191 lb 9.6 oz (86.9 kg)  12/30/21 190 lb (86.2 kg)      Vital signs reviewed  11/01/2022  - Note at rest 02 sats  ***% on ***   General appearance:    ***             Assessment

## 2022-11-23 ENCOUNTER — Other Ambulatory Visit: Payer: Self-pay | Admitting: Internal Medicine

## 2022-11-27 ENCOUNTER — Ambulatory Visit: Payer: Medicaid Other | Admitting: Gastroenterology

## 2022-11-28 ENCOUNTER — Ambulatory Visit (HOSPITAL_COMMUNITY)
Admission: RE | Admit: 2022-11-28 | Discharge: 2022-11-28 | Disposition: A | Discharge status: Home / Self Care | Payer: Medicaid Other | Source: Ambulatory Visit | Attending: Physical Medicine & Rehabilitation | Admitting: Physical Medicine & Rehabilitation

## 2022-11-28 DIAGNOSIS — R29818 Other symptoms and signs involving the nervous system: Secondary | ICD-10-CM | POA: Diagnosis present

## 2022-11-28 DIAGNOSIS — M47816 Spondylosis without myelopathy or radiculopathy, lumbar region: Secondary | ICD-10-CM | POA: Diagnosis present

## 2022-12-04 ENCOUNTER — Ambulatory Visit: Payer: Medicaid Other | Admitting: Gastroenterology

## 2022-12-04 ENCOUNTER — Encounter: Payer: Self-pay | Admitting: Gastroenterology

## 2022-12-04 VITALS — BP 117/81 | HR 90 | Temp 97.9°F | Ht 66.0 in | Wt 181.2 lb

## 2022-12-04 DIAGNOSIS — K648 Other hemorrhoids: Secondary | ICD-10-CM

## 2022-12-04 NOTE — Progress Notes (Signed)
GI Office Note    Referring Provider: Smith Robert, MD Primary Care Physician:  Smith Robert, MD (Inactive)  Primary Gastroenterologist:Charles K. Marletta Lor, DO   Chief Complaint   Chief Complaint  Patient presents with   Follow-up    Also wants to discuss hemorrhoid banding.    History of Present Illness   Chloe Joyce is a 52 y.o. female presenting today for follow-up.  Last seen in the office December 2023.  Planing of right-sided abdominal pain at that time, needing colon cancer screening.  Patient's right-sided abdominal pain worse with heavy lifting but some association with bowel movements.  Question of possible musculoskeletal component versus adhesions.  Patient returns for follow up of recent colonoscopy. History of right sided abdominal pain off and on, in the past. Not really bothered by it lately. Some reflux but does not take pantoprazole daily, just as needed. Patient wants to discuss hemorrhoid banding. She has had prior hemorrhoidectomy in the past. Hemorrhoids bother her off/on. Complains of swelling, pain, and intermittent rectal bleeding. She would like to consider more definitive treatment.   Colonoscopy January 2024: -Nonbleeding internal hemorrhoids -7 mm polyp removed from sigmoid colon, hyperplastic -Next colonoscopy in 10 years  Medications   Current Outpatient Medications  Medication Sig Dispense Refill   albuterol (PROVENTIL HFA;VENTOLIN HFA) 108 (90 BASE) MCG/ACT inhaler Inhale 1-2 puffs into the lungs every 6 (six) hours as needed for wheezing or shortness of breath. 1 Inhaler 0   ALPRAZolam (XANAX) 1 MG tablet Take 1 mg by mouth 3 (three) times daily as needed for anxiety.     budesonide-formoterol (SYMBICORT) 80-4.5 MCG/ACT inhaler TAKE 2 PUFFS FIRST THING IN THE MORNING AND THEN ANOTHER 2 PUFFS ABOUT 12 HOURS LATER 10.2 g 1   cyclobenzaprine (FLEXERIL) 10 MG tablet Take 1 tablet (10 mg total) by mouth 3 (three) times daily as needed for  muscle spasms. (Patient taking differently: Take 10 mg by mouth 3 (three) times daily.) 15 tablet 0   fenofibrate (TRICOR) 145 MG tablet Take 145 mg by mouth daily.     fluticasone (FLONASE) 50 MCG/ACT nasal spray Place 2 sprays daily as needed into both nostrils for allergies or rhinitis.     furosemide (LASIX) 20 MG tablet Take 20 mg by mouth daily as needed for fluid.     hydrOXYzine (VISTARIL) 50 MG capsule Take 50 mg by mouth daily.     loratadine (CLARITIN) 10 MG tablet Take 10 mg daily by mouth.     montelukast (SINGULAIR) 10 MG tablet Take 10 mg daily by mouth.     naproxen (NAPROSYN) 500 MG tablet Take 1 tablet (500 mg total) by mouth 2 (two) times daily. 20 tablet 0   pantoprazole (PROTONIX) 40 MG tablet Take 40 mg by mouth daily before breakfast.     simvastatin (ZOCOR) 40 MG tablet Take 40 mg by mouth every evening.      traMADol (ULTRAM) 50 MG tablet Take by mouth every 6 (six) hours as needed.     traZODone (DESYREL) 100 MG tablet Take 100 mg by mouth at bedtime.     No current facility-administered medications for this visit.    Allergies   Allergies as of 12/04/2022   (No Known Allergies)      Review of Systems   General: Negative for anorexia, weight loss, fever, chills, fatigue, weakness. ENT: Negative for hoarseness, difficulty swallowing , nasal congestion. CV: Negative for chest pain, angina, palpitations, dyspnea on exertion, peripheral edema.  Respiratory: Negative for dyspnea at rest, dyspnea on exertion, cough, sputum, wheezing.  GI: See history of present illness. GU:  Negative for dysuria, hematuria, urinary incontinence, urinary frequency, nocturnal urination.  Endo: Negative for unusual weight change.     Physical Exam   BP 117/81 (BP Location: Right Arm, Patient Position: Sitting, Cuff Size: Normal)   Pulse 90   Temp 97.9 F (36.6 C) (Oral)   Ht 5\' 6"  (1.676 m)   Wt 181 lb 3.2 oz (82.2 kg)   LMP  (LMP Unknown)   SpO2 96%   BMI 29.25 kg/m     General: Well-nourished, well-developed in no acute distress.  Eyes: No icterus. Mouth: Oropharyngeal mucosa moist and pink  Abdomen: Bowel sounds are normal, nontender, nondistended, no hepatosplenomegaly or masses,  no abdominal bruits or hernia , no rebound or guarding.  Rectal: not performed Extremities: No lower extremity edema. No clubbing or deformities. Neuro: Alert and oriented x 4   Skin: Warm and dry, no jaundice.   Psych: Alert and cooperative, normal mood and affect.  Labs   Lab Results  Component Value Date   CREATININE 0.76 08/22/2022   BUN 7 08/22/2022   NA 136 08/22/2022   K 4.0 08/22/2022   CL 102 08/22/2022   CO2 24 08/22/2022    Imaging Studies   MR LUMBAR SPINE WO CONTRAST  Result Date: 11/30/2022 CLINICAL DATA:  Chronic low back pain extending into both legs. Neurogenic claudication. EXAM: MRI LUMBAR SPINE WITHOUT CONTRAST TECHNIQUE: Multiplanar, multisequence MR imaging of the lumbar spine was performed. No intravenous contrast was administered. COMPARISON:  Lumbar MRI 03/28/2018.  Radiographs 12/09/2014. FINDINGS: Segmentation: Conventional anatomy assumed, with the last open disc space designated L5-S1.Concordant with prior imaging. Alignment: Progressive degenerative anterolisthesis at L4-5, now measuring 5 mm. Otherwise normal. Vertebrae: No worrisome osseous lesion, acute fracture or pars defect. Mild sacroiliac degenerative changes bilaterally. Conus medullaris: Extends to the L1 level and appears normal. Paraspinal and other soft tissues: No significant paraspinal findings. Disc levels: Sagittal images demonstrate no significant disc space findings within the visualized lower thoracic spine. L1-2: Preserved disc height and hydration. Minimal disc bulging. No spinal stenosis or nerve root encroachment. L2-3: Normal interspace. L3-4: Disc height and hydration are maintained. Mild facet hypertrophy. No spinal stenosis or nerve root encroachment. L4-5: Advanced  bilateral facet hypertrophy again noted accounting for the progressive degenerative anterolisthesis. Resulting mildly increased multifactorial spinal stenosis, now moderate. There is asymmetric narrowing of the right lateral recess and progressive moderate right-greater-than-left foraminal narrowing. L5-S1: Stable loss of disc height with annular disc bulging and endplate osteophytes asymmetric to the left. Moderate facet hypertrophy with stable trace anterolisthesis. Mild left foraminal narrowing appears unchanged. The spinal canal, lateral recesses and right foramen are widely patent. IMPRESSION: 1. Progressive degenerative anterolisthesis at L4-5 with resulting moderate multifactorial spinal stenosis, asymmetric narrowing of the right lateral recess and progressive moderate right-greater-than-left foraminal narrowing. 2. Stable chronic spondylosis at L5-S1 with mild left foraminal narrowing. 3. No acute findings. Electronically Signed   By: Carey Bullocks M.D.   On: 11/30/2022 12:51    Assessment   Internal hemorrhoids: patient has intermittent symptoms of pain, swelling, bleeding and would like definitive treatment of her hemorrhoids. Recent colonoscopy with internal hemorrhoids noted.    PLAN   Return for hemorrhoid banding with Lewie Loron, NP or Brooke Bonito, NP.    Leanna Battles. Melvyn Neth, MHS, PA-C [facility]

## 2022-12-04 NOTE — Patient Instructions (Signed)
Appointment to be made for hemorrhoid banding.

## 2022-12-20 NOTE — Progress Notes (Deleted)
   [facility] Banding Procedure Note:   Chloe Joyce is a 52 y.o. female presenting today for consideration of hemorrhoid banding. Last colonoscopy 08/25/22 with non bleeding internal hemorrhoids, 7mm polyp in the sigmoid (hyperplastic). She reported prior history of hemorrhoidectomy.   Latex Allergy: yes/no***  The patient presents with symptomatic grade *** hemorrhoids, unresponsive to maximal medical therapy, requesting rubber band ligation of his/her hemorrhoidal disease. All risks, benefits, and alternative forms of therapy were described and informed consent was obtained.  In the left lateral decubitus position (if anoscopy is performed) anoscopic examination revealed grade *** hemorrhoids in the *** position (s).  The decision was made to band the *** internal hemorrhoid, and the [facility] System was used to perform band ligation without complication. Digital anorectal examination was then performed to assure proper positioning of the band, and to adjust the banded tissue as required. The patient was discharged home without pain or other issues. Dietary and behavioral recommendations were given and (if necessary prescriptions were given), along with follow-up instructions. The patient will return in *** for followup and possible additional banding as required.  No complications were encountered and the patient tolerated the procedure well.    Brooke Bonito, MSN, FNP-BC, AGACNP-BC [facility]

## 2022-12-21 ENCOUNTER — Encounter: Payer: Medicaid Other | Admitting: Gastroenterology

## 2022-12-25 ENCOUNTER — Encounter: Payer: Self-pay | Admitting: Gastroenterology

## 2022-12-28 ENCOUNTER — Encounter: Payer: Self-pay | Admitting: Internal Medicine

## 2022-12-28 ENCOUNTER — Ambulatory Visit: Payer: Medicaid Other | Admitting: Internal Medicine

## 2022-12-28 VITALS — BP 123/72 | HR 95 | Ht 65.25 in | Wt 179.6 lb

## 2022-12-28 DIAGNOSIS — J4489 Other specified chronic obstructive pulmonary disease: Secondary | ICD-10-CM

## 2022-12-28 DIAGNOSIS — R490 Dysphonia: Secondary | ICD-10-CM | POA: Diagnosis not present

## 2022-12-28 DIAGNOSIS — F17210 Nicotine dependence, cigarettes, uncomplicated: Secondary | ICD-10-CM

## 2022-12-28 MED ORDER — SYMBICORT 80-4.5 MCG/ACT IN AERO: INHALATION_SPRAY | RESPIRATORY_TRACT | 11 refills | Status: DC

## 2022-12-28 MED ORDER — ALBUTEROL SULFATE HFA 108 (90 BASE) MCG/ACT IN AERS: 1.0000 | INHALATION_SPRAY | RESPIRATORY_TRACT | 11 refills | Status: DC | PRN

## 2022-12-28 MED ORDER — LORATADINE 10 MG PO TABS: 10.0000 mg | ORAL_TABLET | Freq: Every day | ORAL | 11 refills | Status: DC

## 2022-12-28 MED ORDER — MONTELUKAST SODIUM 10 MG PO TABS: 10.0000 mg | ORAL_TABLET | Freq: Every day | ORAL | 11 refills | Status: DC

## 2022-12-28 NOTE — Progress Notes (Signed)
Chloe Joyce, female    DOB: [birth year],    MRN: [id number]   Brief patient profile:  33  yowf active smoker  "asthma since childhood" self- referred to pulmonary clinic in [place] 11/09/2021 for AB and wants to be cleared for R shoulder surgery.      History of Present Illness  11/09/2021  Pulmonary/ 1st office eval/ Chloe Joyce / [place] [facility] maint on singulair and theophylline Chief Complaint  Patient presents with   Consult    Consult for asthma   Dyspnea: with   housework/ can barely walk across parking lot/ leans buggy to do grocery store Cough: variable but some worse in am and hs  Sleep: needs inhaler at least once each noct  SABA use: qid albuterol  Rec Plan A = Automatic = Always=    Symbicort 80 Take 2 puffs first thing in am and then another 2 puffs about 12 hours later and within a week you should be able to stop the theophylline permanently  Work on inhaler technique:  Plan B = Backup (to supplement plan A, not to replace it) Only use your albuterol inhaler as a rescue medication  Plan C = Crisis (instead of Plan B but only if Plan B stops working) - only use your albuterol nebulizer if you first try Plan B and it fails to help       I will be referring you for ENT evaluation   Please schedule a follow up office visit in 4 weeks, call sooner if needed with all medications /inhalers/ solutions in hand   11/09/2021  :  Allergy screen  Eos 0.6  IgE 830    alpha one AT phenotype  MM  Level 136  01/12/22 VC polypectomy  Dr Jenne Pane   12/28/2022  f/u ov/ office/Chloe Joyce re: AB maint on symbicort 80 2 each am  did not  bring meds  Chief Complaint  Patient presents with   Follow-up    Pt f/u states that he breathing is baseline, is requesting refills no multiple medications  Dyspnea:  mostly  slowed by back now Cough: smoker's rattle  Sleeping: better since vc surgery  SABA use: not needing  02: not    Lung cancer screening: rec 12/28/2022    No obvious day to  day or daytime variability or assoc excess/ purulent sputum or mucus plugs or hemoptysis or cp or chest tightness, subjective wheeze or overt sinus or hb symptoms.     Also denies any obvious fluctuation of symptoms with weather or environmental changes or other aggravating or alleviating factors except as outlined above   No unusual exposure hx or h/o childhood pna  or knowledge of premature birth.  Current Allergies, Complete Past Medical History, Past Surgical History, Family History, and Social History were reviewed in [employer] record.  ROS  The following are not active complaints unless bolded Hoarseness, sore throat, dysphagia, dental problems, itching, sneezing,  nasal congestion or discharge of excess mucus or purulent secretions, ear ache,   fever, chills, sweats, unintended wt loss or wt gain, classically pleuritic or exertional cp,  orthopnea pnd or arm/hand swelling  or leg swelling, presyncope, palpitations, abdominal pain, anorexia, nausea, vomiting, diarrhea  or change in bowel habits or change in bladder habits, change in stools or change in urine, dysuria, hematuria,  rash, arthralgias, visual complaints, headache, numbness, weakness or ataxia or problems with walking or coordination,  change in mood or  memory.  Current Meds  Medication Sig   albuterol (PROVENTIL HFA;VENTOLIN HFA) 108 (90 BASE) MCG/ACT inhaler Inhale 1-2 puffs into the lungs every 6 (six) hours as needed for wheezing or shortness of breath.   ALPRAZolam (XANAX) 1 MG tablet Take 1 mg by mouth 3 (three) times daily as needed for anxiety.   budesonide-formoterol (SYMBICORT) 80-4.5 MCG/ACT inhaler TAKE 2 PUFFS FIRST THING IN THE MORNING AND THEN ANOTHER 2 PUFFS ABOUT 12 HOURS LATER   cyclobenzaprine (FLEXERIL) 10 MG tablet Take 1 tablet (10 mg total) by mouth 3 (three) times daily as needed for muscle spasms. (Patient taking differently: Take 10 mg by mouth 3 (three) times daily.)    fenofibrate (TRICOR) 145 MG tablet Take 145 mg by mouth daily.   fluticasone (FLONASE) 50 MCG/ACT nasal spray Place 2 sprays daily as needed into both nostrils for allergies or rhinitis.   furosemide (LASIX) 20 MG tablet Take 20 mg by mouth daily as needed for fluid.   hydrOXYzine (VISTARIL) 50 MG capsule Take 50 mg by mouth daily.   loratadine (CLARITIN) 10 MG tablet Take 10 mg daily by mouth.   montelukast (SINGULAIR) 10 MG tablet Take 10 mg daily by mouth.   naproxen (NAPROSYN) 500 MG tablet Take 1 tablet (500 mg total) by mouth 2 (two) times daily.   pantoprazole (PROTONIX) 40 MG tablet Take 40 mg by mouth daily before breakfast.   simvastatin (ZOCOR) 40 MG tablet Take 40 mg by mouth every evening.    traMADol (ULTRAM) 50 MG tablet Take by mouth every 6 (six) hours as needed.   traZODone (DESYREL) 100 MG tablet Take 100 mg by mouth at bedtime.           Past Medical History:  Diagnosis Date   Ankle injury    Anxiety    Arthritis    Asthma    Back pain    Headache    Hyperlipidemia        Objective:    Wts  12/28/2022          179   12/04/22 181 lb 3.2 oz (82.2 kg)  08/25/22 180 lb (81.6 kg)  07/26/22 191 lb 9.6 oz (86.9 kg)      Vital signs reviewed  12/28/2022  - Note at rest 02 sats  97% on RA   General appearance:    somber amb wf nad/smoker's rattle       HEENT : Oropharynx  clear     Nasal turbinates nl    NECK :  without  apparent JVD/ palpable Nodes/TM    LUNGS: no acc muscle use,  Nl contour chest with scattered insp / exp rhonchi    CV:  RRR  no s3 or murmur or increase in P2, and no edema   ABD:  soft and nontender with nl inspiratory excursion in the supine position. No bruits or organomegaly appreciated   MS:  Nl gait/ ext warm without deformities Or obvious joint restrictions  calf tenderness, cyanosis or clubbing    SKIN: warm and dry without lesions    NEURO:  alert, approp, nl sensorium with  no motor or cerebellar deficits apparent.             Assessment

## 2022-12-28 NOTE — Patient Instructions (Addendum)
I refilled your asthma and allergy medications for a year.  Work on inhaler technique:  relax and gently blow all the way out then take a nice smooth full deep breath back in, triggering the inhaler at same time you start breathing in.  Hold breath in for at least  5 seconds if you can. Blow out symbicort thru nose. Rinse and gargle with water when done.  If mouth or throat bother you at all,  try brushing teeth/gums/tongue with arm and hammer toothpaste/ make a slurry and gargle and spit out.   My office will be contacting you by phone for referral to lung cancer screening and PFTs Number is [phone] (they will call you to arrange, can be done at [facility]) - if you don't hear back from my office within one week please call us back or notify us thru MyChart and we'll address it right away.   The key is to stop smoking completely before smoking completely stops you!  Please schedule a follow up visit in12 months but call sooner if needed  with all medications /inhalers/ solutions in hand so we can verify exactly what you are taking. This includes all medications from all doctors and over the counters

## 2022-12-29 NOTE — Assessment & Plan Note (Addendum)
Referred for lung cancer screening 12/28/2022   4-5 min discussion re active cigarette smoking in addition to office E&M  Ask about tobacco use:   ongoing  Advise quitting   I took an extended  opportunity with this patient to outline the consequences of continued cigarette use  in airway disorders based on all the data we have from the multiple national lung health studies (perfomed over decades at millions of dollars in cost)  indicating that smoking cessation, not choice of inhalers or physicians, is the most important aspect of care.   Assess willingness:  Not fully committed at this point Assist in quit attempt:  Per PCP when ready Arrange follow up:   Follow up per Primary Care planned     Low-dose CT lung cancer screening is recommended for patients who are 64-43 years of age with a 20+ pack-year history of smoking and who are currently smoking or quit <=15 years ago. No coughing up blood  No unintentional weight loss of > 15 pounds in the last 6 months - pt is eligible for scanning yearly until 75 y after she quits, which hopefully wil be this year > referred

## 2022-12-29 NOTE — Assessment & Plan Note (Addendum)
Onset ? > ent referral 11/09/2021 with assoc upper airway wheezing  - 01/12/22 VC polypectomy  Dr Jenne Pane  Doing much better in terms of upper airway symptoms but will keep the dose of ICS down with low threshold to f/u with Dr Jenne Pane if any recurrence of symptoms  Each maintenance medication was reviewed in detail including emphasizing most importantly the difference between maintenance and prns and under what circumstances the prns are to be triggered using an action plan format where appropriate.  Total time for H and P, chart review, counseling, reviewing hfa device(s) and generating customized AVS unique to this office visit / same day charting = 30 min with pt not seen in > 1 y

## 2022-12-29 NOTE — Assessment & Plan Note (Addendum)
Active smoker -  Chest CTa 09/13/20 Upper lobe predominant emphysema  - 11/09/2021  After extensive coaching inhaler device,  effectiveness =    75% from a baseline near 0 so rec symbicort 80 2bid and d/c theoph with approp saba  -  11/09/2021  :  Allergy screen  Eos 0.6  IgE 830    alpha one AT phenotype  MM  Level 136 - Spirometry  02/07/22  FEV1 2.56 (89%)  Ratio 0.69    She fits a pattern of AB more so than copd so ok to continue low dose symbicort so as not to trigger any further upper airway issues and continue singulair as well and allergy f/u prn

## 2023-06-27 ENCOUNTER — Other Ambulatory Visit: Payer: Self-pay | Admitting: Emergency Medicine

## 2023-06-27 DIAGNOSIS — Z87891 Personal history of nicotine dependence: Secondary | ICD-10-CM

## 2023-06-27 DIAGNOSIS — Z122 Encounter for screening for malignant neoplasm of respiratory organs: Secondary | ICD-10-CM

## 2023-06-27 DIAGNOSIS — F17210 Nicotine dependence, cigarettes, uncomplicated: Secondary | ICD-10-CM

## 2023-07-03 ENCOUNTER — Ambulatory Visit: Payer: Medicaid Other | Admitting: Acute Care

## 2023-07-03 ENCOUNTER — Encounter: Payer: Self-pay | Admitting: Acute Care

## 2023-07-03 DIAGNOSIS — F17210 Nicotine dependence, cigarettes, uncomplicated: Secondary | ICD-10-CM

## 2023-07-03 NOTE — Patient Instructions (Signed)

## 2023-07-03 NOTE — Progress Notes (Signed)
Virtual Visit via Telephone Note  I connected with Chloe Joyce on 07/03/23 at  4:00 PM EST by telephone and verified that I am speaking with the correct person using two identifiers.  Location: Patient:  At home Provider:  [address] [place], [place], [address]    I discussed the limitations, risks, security and privacy concerns of performing an evaluation and management service by telephone and the availability of in person appointments. I also discussed with the patient that there may be a patient responsible charge related to this service. The patient expressed understanding and agreed to proceed.   Shared Decision Making Visit Lung Cancer Screening Program [phone])   Eligibility: Age 52 y.o. Pack Years Smoking History Calculation  39 pack year smoking history (# packs/per year x # years smoked) Recent History of coughing up blood  no Unexplained weight loss? no ( >Than 15 pounds within the last 6 months ) Prior History Lung / other cancer no (Diagnosis within the last 5 years already requiring surveillance chest CT Scans). Smoking Status Current Smoker Former Smokers: Years since quit:  NA  Quit Date:  NA  Visit Components: Discussion included one or more decision making aids. yes Discussion included risk/benefits of screening. yes Discussion included potential follow up diagnostic testing for abnormal scans. yes Discussion included meaning and risk of over diagnosis. yes Discussion included meaning and risk of False Positives. yes Discussion included meaning of total radiation exposure. yes  Counseling Included: Importance of adherence to annual lung cancer LDCT screening. yes Impact of comorbidities on ability to participate in the program. yes Ability and willingness to under diagnostic treatment. yes  Smoking Cessation Counseling: Current Smokers:  Discussed importance of smoking cessation. yes Information about tobacco cessation classes and  interventions provided to patient. yes Patient provided with "ticket" for LDCT Scan. yes Symptomatic Patient. no  Counseling NA Diagnosis Code: Tobacco Use Z72.0 Asymptomatic Patient yes  Counseling (Intermediate counseling: > three minutes counseling) M5784 Former Smokers:  Discussed the importance of maintaining cigarette abstinence. yes Diagnosis Code: Personal History of Nicotine Dependence. O96.295 Information about tobacco cessation classes and interventions provided to patient. Yes Patient provided with "ticket" for LDCT Scan. yes Written Order for Lung Cancer Screening with LDCT placed in Epic. Yes (CT Chest Lung Cancer Screening Low Dose W/O CM) [id number] Z12.2-Screening of respiratory organs Z87.891-Personal history of nicotine dependence   Bevelyn Ngo, NP 07/03/2023

## 2023-07-10 ENCOUNTER — Other Ambulatory Visit: Payer: Medicaid Other

## 2023-07-10 ENCOUNTER — Ambulatory Visit
Admission: RE | Admit: 2023-07-10 | Discharge: 2023-07-10 | Disposition: A | Discharge status: Home / Self Care | Payer: Medicaid Other | Source: Ambulatory Visit | Attending: Acute Care | Admitting: Acute Care

## 2023-07-10 DIAGNOSIS — F17210 Nicotine dependence, cigarettes, uncomplicated: Secondary | ICD-10-CM | POA: Insufficient documentation

## 2023-07-10 DIAGNOSIS — Z122 Encounter for screening for malignant neoplasm of respiratory organs: Secondary | ICD-10-CM | POA: Diagnosis present

## 2023-07-10 DIAGNOSIS — Z87891 Personal history of nicotine dependence: Secondary | ICD-10-CM | POA: Insufficient documentation

## 2023-07-31 ENCOUNTER — Other Ambulatory Visit: Payer: Self-pay

## 2023-07-31 DIAGNOSIS — Z122 Encounter for screening for malignant neoplasm of respiratory organs: Secondary | ICD-10-CM

## 2023-07-31 DIAGNOSIS — F17210 Nicotine dependence, cigarettes, uncomplicated: Secondary | ICD-10-CM

## 2023-07-31 DIAGNOSIS — Z87891 Personal history of nicotine dependence: Secondary | ICD-10-CM

## 2023-12-24 ENCOUNTER — Other Ambulatory Visit: Payer: Self-pay | Admitting: Internal Medicine

## 2023-12-24 NOTE — Telephone Encounter (Signed)
 Copied from CRM [id number]. Topic: Clinical - Medication Refill >> Dec 24, 2023  4:39 PM Eveleen Hinds B wrote: Most Recent Primary Care Visit:   Medication: budesonide -formoterol  (SYMBICORT ) 80-4.5 MCG/ACT inhaler loratadine  (CLARITIN ) 10 MG tablet montelukast  (SINGULAIR ) 10 MG tablet  Has the patient contacted their pharmacy? Yes (Agent: If no, request that the patient contact the pharmacy for the refill. If patient does not wish to contact the pharmacy document the reason why and proceed with request.) (Agent: If yes, when and what did the pharmacy advise?)  Is this the correct pharmacy for this prescription? Yes If no, delete pharmacy and type the correct one.  This is the patient's preferred pharmacy:  [facility] - [place], [place] - [address] [place] [address] Phone: [phone] Fax: [phone]  Has the prescription been filled recently? Yes  Is the patient out of the medication? No  Has the patient been seen for an appointment in the last year OR does the patient have an upcoming appointment? Yes  Can we respond through MyChart? No  Agent: Please be advised that Rx refills may take up to 3 business days. We ask that you follow-up with your pharmacy.

## 2023-12-25 MED ORDER — LORATADINE 10 MG PO TABS: 10.0000 mg | ORAL_TABLET | Freq: Every day | ORAL | 11 refills | Status: DC

## 2023-12-25 MED ORDER — MONTELUKAST SODIUM 10 MG PO TABS: 10.0000 mg | ORAL_TABLET | Freq: Every day | ORAL | 11 refills | Status: DC

## 2023-12-25 MED ORDER — BUDESONIDE-FORMOTEROL FUMARATE 80-4.5 MCG/ACT IN AERO: INHALATION_SPRAY | RESPIRATORY_TRACT | 11 refills | Status: DC
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
## 2024-02-20 ENCOUNTER — Ambulatory Visit: Admitting: Internal Medicine

## 2024-02-20 ENCOUNTER — Encounter: Payer: Self-pay | Admitting: Internal Medicine

## 2024-02-20 VITALS — BP 105/74 | HR 90 | Ht 66.0 in | Wt 181.2 lb

## 2024-02-20 DIAGNOSIS — F17210 Nicotine dependence, cigarettes, uncomplicated: Secondary | ICD-10-CM | POA: Diagnosis not present

## 2024-02-20 DIAGNOSIS — J4489 Other specified chronic obstructive pulmonary disease: Secondary | ICD-10-CM | POA: Diagnosis not present

## 2024-02-20 MED ORDER — LORATADINE 10 MG PO TABS: 10.0000 mg | ORAL_TABLET | Freq: Every day | ORAL | 3 refills | Status: AC

## 2024-02-20 MED ORDER — BUDESONIDE-FORMOTEROL FUMARATE 80-4.5 MCG/ACT IN AERO: INHALATION_SPRAY | RESPIRATORY_TRACT | 3 refills | Status: AC

## 2024-02-20 MED ORDER — MONTELUKAST SODIUM 10 MG PO TABS: 10.0000 mg | ORAL_TABLET | Freq: Every day | ORAL | 3 refills | Status: AC

## 2024-02-20 MED ORDER — ALBUTEROL SULFATE HFA 108 (90 BASE) MCG/ACT IN AERS: 1.0000 | INHALATION_SPRAY | RESPIRATORY_TRACT | 3 refills | Status: AC | PRN

## 2024-02-20 NOTE — Assessment & Plan Note (Signed)
 Active smoker -  Chest CTa 09/13/20 Upper lobe predominant emphysema  - 11/09/2021  After extensive coaching inhaler device,  effectiveness =    75% from a baseline near 0 so rec symbicort  80 2bid and d/c theoph with approp saba  -  11/09/2021  :  Allergy screen  Eos 0.6  IgE 830    alpha one AT phenotype  MM  Level 136 - Spirometry  02/07/22  FEV1 2.56 (89%)  Ratio 0.69    She has borderline copd critieria but clinically purely AB now aggravated by sinus infection but still with clear exam 8 h p last saba while  inconsistent with maint rx due to access issues   Rec: continue symbicort  80/singulair  and f/u yearly with ent in meantime prn   Discussed in detail all the  indications, usual  risks and alternatives  relative to the benefits with patient who agrees to proceed with Rx as outlined.

## 2024-02-20 NOTE — Assessment & Plan Note (Addendum)
 In [facility] program since 07/10/23   Counseled re importance of smoking cessation but did not meet time criteria for separate billing       Each maintenance medication was reviewed in detail including emphasizing most importantly the difference between maintenance and prns and under what circumstances the prns are to be triggered using an action plan format where appropriate.  Total time for H and P, chart review, counseling, reviewing hfa  device(s) and generating customized AVS unique to this office visit / same day charting = 32 min

## 2024-02-20 NOTE — Patient Instructions (Signed)
 Work on inhaler technique:  relax and gently blow all the way out then take a nice smooth full deep breath back in, triggering the inhaler at same time you start breathing in.  Hold breath in for at least  5 seconds if you can. Blow out Symbicort  80  thru nose. Rinse and gargle with water when done.  If mouth or throat bother you at all,  try brushing teeth/gums/tongue with arm and hammer toothpaste/ make a slurry and gargle and spit out.   The key is to stop smoking completely before smoking completely stops you!  Call for referral to [facility] if not better after you finish your course of therapy for your sinus infection  Please schedule a follow up visit in 12 months but call sooner if needed with inhalers in hand

## 2024-05-20 ENCOUNTER — Other Ambulatory Visit (HOSPITAL_COMMUNITY): Payer: Self-pay | Admitting: Nurse Practitioner

## 2024-05-20 DIAGNOSIS — Z1231 Encounter for screening mammogram for malignant neoplasm of breast: Secondary | ICD-10-CM

## 2024-05-28 ENCOUNTER — Other Ambulatory Visit: Payer: Self-pay

## 2024-05-28 ENCOUNTER — Emergency Department (HOSPITAL_COMMUNITY)
Admission: EM | Admit: 2024-05-28 | Discharge: 2024-05-28 | Disposition: A | Discharge status: Home / Self Care | Attending: Emergency Medicine | Admitting: Emergency Medicine

## 2024-05-28 ENCOUNTER — Ambulatory Visit (HOSPITAL_COMMUNITY)
Admission: EM | Admit: 2024-05-28 | Discharge: 2024-05-28 | Disposition: A | Discharge status: Home / Self Care | Source: Ambulatory Visit | Attending: Emergency Medicine | Admitting: Emergency Medicine

## 2024-05-28 ENCOUNTER — Encounter (HOSPITAL_COMMUNITY): Payer: Self-pay

## 2024-05-28 DIAGNOSIS — T7621XA Adult sexual abuse, suspected, initial encounter: Secondary | ICD-10-CM | POA: Insufficient documentation

## 2024-05-28 DIAGNOSIS — T7421XA Adult sexual abuse, confirmed, initial encounter: Secondary | ICD-10-CM

## 2024-05-28 DIAGNOSIS — Z0441 Encounter for examination and observation following alleged adult rape: Secondary | ICD-10-CM | POA: Insufficient documentation
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
## 2024-05-28 NOTE — ED Notes (Signed)
Sane nurse is at bedside. 

## 2024-05-28 NOTE — ED Notes (Signed)
 Shirleyann, ED sec paged SANE RN to DR Roselyn phone
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
## 2024-05-29 LAB — GC/CHLAMYDIA PROBE AMP (~~LOC~~) NOT AT ARMC
Chlamydia: NEGATIVE
Comment: NEGATIVE
Comment: NORMAL
Neisseria Gonorrhea: NEGATIVE
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
## 2024-06-02 ENCOUNTER — Ambulatory Visit (HOSPITAL_COMMUNITY)
Admission: RE | Admit: 2024-06-02 | Discharge: 2024-06-02 | Disposition: A | Discharge status: Home / Self Care | Source: Ambulatory Visit | Attending: Nurse Practitioner | Admitting: Nurse Practitioner

## 2024-06-02 ENCOUNTER — Encounter (HOSPITAL_COMMUNITY): Payer: Self-pay

## 2024-06-02 DIAGNOSIS — Z1231 Encounter for screening mammogram for malignant neoplasm of breast: Secondary | ICD-10-CM | POA: Diagnosis present
